# Patient Record
Sex: Male | Born: 1935 | Race: Black or African American | Hispanic: No | Marital: Single | Smoking: Current every day smoker
Health system: Southern US, Community
[De-identification: ages and names within clinical notes are randomized; demographics above are authoritative.]

## PROBLEM LIST (undated history)

## (undated) DIAGNOSIS — Z970 Presence of artificial eye: Secondary | ICD-10-CM

## (undated) DIAGNOSIS — I219 Acute myocardial infarction, unspecified: Secondary | ICD-10-CM

## (undated) DIAGNOSIS — E785 Hyperlipidemia, unspecified: Secondary | ICD-10-CM

## (undated) DIAGNOSIS — J449 Chronic obstructive pulmonary disease, unspecified: Secondary | ICD-10-CM

## (undated) DIAGNOSIS — I10 Essential (primary) hypertension: Secondary | ICD-10-CM

## (undated) HISTORY — PX: CORONARY ANGIOPLASTY WITH STENT PLACEMENT: SHX49

---

## 2009-10-11 DEATH — deceased

## 2015-10-19 ENCOUNTER — Emergency Department (HOSPITAL_BASED_OUTPATIENT_CLINIC_OR_DEPARTMENT_OTHER): Payer: Medicare Other

## 2015-10-19 ENCOUNTER — Encounter (HOSPITAL_BASED_OUTPATIENT_CLINIC_OR_DEPARTMENT_OTHER): Payer: Self-pay | Admitting: Emergency Medicine

## 2015-10-19 ENCOUNTER — Other Ambulatory Visit: Payer: Self-pay

## 2015-10-19 ENCOUNTER — Emergency Department (HOSPITAL_BASED_OUTPATIENT_CLINIC_OR_DEPARTMENT_OTHER)
Admission: EM | Admit: 2015-10-19 | Discharge: 2015-10-19 | Disposition: A | Discharge status: Home / Self Care | Payer: Medicare Other | Attending: Emergency Medicine | Admitting: Emergency Medicine

## 2015-10-19 DIAGNOSIS — J449 Chronic obstructive pulmonary disease, unspecified: Secondary | ICD-10-CM | POA: Insufficient documentation

## 2015-10-19 DIAGNOSIS — R079 Chest pain, unspecified: Secondary | ICD-10-CM | POA: Diagnosis present

## 2015-10-19 DIAGNOSIS — I2510 Atherosclerotic heart disease of native coronary artery without angina pectoris: Secondary | ICD-10-CM | POA: Insufficient documentation

## 2015-10-19 DIAGNOSIS — I10 Essential (primary) hypertension: Secondary | ICD-10-CM | POA: Insufficient documentation

## 2015-10-19 DIAGNOSIS — I252 Old myocardial infarction: Secondary | ICD-10-CM | POA: Diagnosis not present

## 2015-10-19 DIAGNOSIS — Z79899 Other long term (current) drug therapy: Secondary | ICD-10-CM | POA: Diagnosis not present

## 2015-10-19 DIAGNOSIS — F17290 Nicotine dependence, other tobacco product, uncomplicated: Secondary | ICD-10-CM | POA: Insufficient documentation

## 2015-10-19 DIAGNOSIS — Z7982 Long term (current) use of aspirin: Secondary | ICD-10-CM | POA: Insufficient documentation

## 2015-10-19 HISTORY — DX: Essential (primary) hypertension: I10

## 2015-10-19 HISTORY — DX: Acute myocardial infarction, unspecified: I21.9

## 2015-10-19 HISTORY — DX: Chronic obstructive pulmonary disease, unspecified: J44.9

## 2015-10-19 HISTORY — DX: Hyperlipidemia, unspecified: E78.5

## 2015-10-19 HISTORY — DX: Presence of artificial eye: Z97.0

## 2015-10-19 LAB — CBC WITH DIFFERENTIAL/PLATELET
BASOS PCT: 0 %
Basophils Absolute: 0 10*3/uL (ref 0.0–0.1)
EOS ABS: 0.3 10*3/uL (ref 0.0–0.7)
EOS PCT: 4 %
HCT: 33.6 % — ABNORMAL LOW (ref 39.0–52.0)
Hemoglobin: 11 g/dL — ABNORMAL LOW (ref 13.0–17.0)
LYMPHS ABS: 1.4 10*3/uL (ref 0.7–4.0)
Lymphocytes Relative: 18 %
MCH: 28.7 pg (ref 26.0–34.0)
MCHC: 32.7 g/dL (ref 30.0–36.0)
MCV: 87.7 fL (ref 78.0–100.0)
MONOS PCT: 17 %
Monocytes Absolute: 1.3 10*3/uL — ABNORMAL HIGH (ref 0.1–1.0)
Neutro Abs: 4.5 10*3/uL (ref 1.7–7.7)
Neutrophils Relative %: 61 %
PLATELETS: 202 10*3/uL (ref 150–400)
RBC: 3.83 MIL/uL — AB (ref 4.22–5.81)
RDW: 17.4 % — ABNORMAL HIGH (ref 11.5–15.5)
WBC: 7.4 10*3/uL (ref 4.0–10.5)

## 2015-10-19 LAB — BASIC METABOLIC PANEL
Anion gap: 6 (ref 5–15)
BUN: 15 mg/dL (ref 6–20)
CALCIUM: 9 mg/dL (ref 8.9–10.3)
CO2: 27 mmol/L (ref 22–32)
Chloride: 106 mmol/L (ref 101–111)
Creatinine, Ser: 1.13 mg/dL (ref 0.61–1.24)
GFR calc Af Amer: >60 mL/min (ref >60)
GFR, EST NON AFRICAN AMERICAN: 59 mL/min — AB (ref >60)
Glucose, Bld: 82 mg/dL (ref 65–99)
Potassium: 3.8 mmol/L (ref 3.5–5.1)
SODIUM: 139 mmol/L (ref 135–145)

## 2015-10-19 LAB — TROPONIN I: Troponin I: <0.03 ng/mL (ref <0.03)

## 2015-10-19 MED ORDER — IPRATROPIUM-ALBUTEROL 0.5-2.5 (3) MG/3ML IN SOLN
3.0000 mL | Freq: Four times a day (QID) | RESPIRATORY_TRACT | Status: DC
  Administered 2015-10-19: 3 mL via RESPIRATORY_TRACT
  Filled 2015-10-19: qty 3

## 2015-10-19 MED ORDER — PREDNISONE 50 MG PO TABS: 60.0000 mg | ORAL_TABLET | Freq: Once | ORAL | Status: DC

## 2015-10-19 MED ORDER — PREDNISONE 50 MG PO TABS: 50.0000 mg | ORAL_TABLET | Freq: Every day | ORAL | 0 refills | Status: DC

## 2015-10-19 NOTE — ED Provider Notes (Signed)
[facility] Provider Note   CSN: [id number] Arrival date & time: 10/19/15  1117     History   Chief Complaint Chief Complaint  Patient presents with  . Chest Pain    HPI Jose Lowery is a 82 y.o. male.  Patient states he started having a sharp pain in the right side of his chest yesterday. It persisted throughout the day. He tried a warm bath without much relief. he was able to go to bed last night when he woke up the pain was still there. He did have one episode yesterday where he was more intense and severe. He was concerned that some think serious might be going on. Patient does have history of heart disease including MI and stent. This feels that this pain is different from that. He denies any fevers or chills. No leg swelling.   The history is provided by the patient.  Chest Pain   This is a new problem. The current episode started yesterday. The problem occurs constantly. The pain is present in the lateral region (right). The pain is moderate. The quality of the pain is described as sharp. The pain does not radiate. Duration of episode(s) is 1 day. Associated symptoms include cough. Pertinent negatives include no abdominal pain, no diaphoresis, no leg pain, no lower extremity edema, no shortness of breath and no vomiting. He has tried nothing for the symptoms. The treatment provided no relief. Risk factors include smoking/tobacco exposure.  His past medical history is significant for CAD and MI.    Past Medical History:  Diagnosis Date  . COPD (chronic obstructive pulmonary disease) (HCC)   . Hyperlipidemia   . Hypertension   . Myocardial infarction   . Prosthetic eye globe    left    There are no active problems to display for this patient.   Past Surgical History:  Procedure Laterality Date  . CORONARY ANGIOPLASTY WITH STENT PLACEMENT         Home Medications    Prior to Admission medications   Medication Sig Start Date End Date Taking?  Authorizing Provider  albuterol (PROVENTIL HFA;VENTOLIN HFA) 108 (90 Base) MCG/ACT inhaler Inhale 2 puffs into the lungs every 6 (six) hours as needed for wheezing or shortness of breath.   Yes Historical Provider, MD  amLODipine (NORVASC) 10 MG tablet Take 10 mg by mouth daily.   Yes Historical Provider, MD  aspirin 81 MG chewable tablet Chew 81 mg by mouth daily.   Yes Historical Provider, MD  atorvastatin (LIPITOR) 80 MG tablet Take 80 mg by mouth daily.   Yes Historical Provider, MD  carvedilol (COREG) 12.5 MG tablet Take 12.5 mg by mouth 2 (two) times daily with a meal.   Yes Historical Provider, MD  hydrochlorothiazide (HYDRODIURIL) 12.5 MG tablet Take 12.5 mg by mouth daily.   Yes Historical Provider, MD  lisinopril (PRINIVIL,ZESTRIL) 40 MG tablet Take 40 mg by mouth daily.   Yes Historical Provider, MD  nitroGLYCERIN (NITROSTAT) 0.4 MG SL tablet Place 0.4 mg under the tongue every 5 (five) minutes as needed for chest pain.   Yes Historical Provider, MD  pantoprazole (PROTONIX) 20 MG tablet Take 20 mg by mouth 2 (two) times daily.   Yes Historical Provider, MD  tamsulosin (FLOMAX) 0.4 MG CAPS capsule Take 0.4 mg by mouth.   Yes Historical Provider, MD  ticagrelor (BRILINTA) 90 MG TABS tablet Take 90 mg by mouth 2 (two) times daily.   Yes Historical Provider, MD  predniSONE (DELTASONE)  50 MG tablet Take 1 tablet (50 mg total) by mouth daily. 10/19/15   Linwood Dibbles, MD    Family History History reviewed. No pertinent family history.  Social History Social History  Substance Use Topics  . Smoking status: Current Every Day Smoker    Packs/day: 0.50    Types: Cigars  . Smokeless tobacco: Never Used  . Alcohol use No     Allergies   Review of patient's allergies indicates no known allergies.   Review of Systems Review of Systems  Constitutional: Negative for diaphoresis.  Respiratory: Positive for cough. Negative for shortness of breath.   Cardiovascular: Positive for chest pain.    Gastrointestinal: Negative for abdominal pain and vomiting.  All other systems reviewed and are negative.    Physical Exam Updated Vital Signs BP 161/89   Pulse 92   Temp 98.1 F (36.7 C) (Oral)   Resp (!) 29   Ht 6\' 3"  (1.905 m)   Wt 86.2 kg   SpO2 94%   BMI 23.75 kg/m   Physical Exam  Constitutional: No distress.  HENT:  Head: Normocephalic and atraumatic.  Right Ear: External ear normal.  Left Ear: External ear normal.  Eyes: Conjunctivae are normal. Right eye exhibits no discharge. Left eye exhibits no discharge. No scleral icterus.  Neck: Neck supple. No tracheal deviation present.  Cardiovascular: Normal rate, regular rhythm and intact distal pulses.   Pulmonary/Chest: Effort normal. No stridor. No respiratory distress. He has wheezes. He has no rales.  Abdominal: Soft. Bowel sounds are normal. He exhibits no distension. There is no tenderness. There is no rebound and no guarding.  Musculoskeletal: He exhibits no edema or tenderness.  Neurological: He is alert. He has normal strength. No cranial nerve deficit (no facial droop, extraocular movements intact, no slurred speech) or sensory deficit. He exhibits normal muscle tone. He displays no seizure activity. Coordination normal.  Skin: Skin is warm and dry. No rash noted.  Psychiatric: He has a normal mood and affect.  Nursing note and vitals reviewed.    ED Treatments / Results  Labs (all labs ordered are listed, but only abnormal results are displayed) Labs Reviewed  CBC WITH DIFFERENTIAL/PLATELET - Abnormal; Notable for the following:       Result Value   RBC 3.83 (*)    Hemoglobin 11.0 (*)    HCT 33.6 (*)    RDW 17.4 (*)    Monocytes Absolute 1.3 (*)    All other components within normal limits  BASIC METABOLIC PANEL - Abnormal; Notable for the following:    GFR calc non Af Amer 59 (*)    All other components within normal limits  TROPONIN I  TROPONIN I    EKG  EKG  Interpretation  Date/Time:  Sunday October 19 2015 11:29:20 EDT Ventricular Rate:  72 PR Interval:    QRS Duration: 96 QT Interval:  394 QTC Calculation: 431 R Axis:   -61 Text Interpretation:  Atrial fibrillation with premature ventricular or aberrantly conducted complexes Left axis deviation Abnormal ECG No significant change since last tracing Confirmed by Bobetta Korf  MD-J, Falon Huesca ([id number]) on 10/19/2015 11:47:05 AM       Radiology Dg Chest 2 View  Result Date: 10/19/2015 CLINICAL DATA:  Chest pain EXAM: CHEST  2 VIEW COMPARISON:  None. FINDINGS: Mild cardiomegaly. The hila and mediastinum are unremarkable. No pneumothorax. No pulmonary nodules, masses, or focal infiltrates. IMPRESSION: No active cardiopulmonary disease. Electronically Signed   By: Gerome Sam III M.D  On: 10/19/2015 12:20    Procedures Procedures (including critical care time)  Medications Ordered in ED Medications  ipratropium-albuterol (DUONEB) 0.5-2.5 (3) MG/3ML nebulizer solution 3 mL (3 mLs Nebulization Given 10/19/15 1224)  predniSONE (DELTASONE) tablet 60 mg (not administered)     Initial Impression / Assessment and Plan / ED Course  I have reviewed the triage vital signs and the nursing notes.  Pertinent labs & imaging results that were available during my care of the patient were reviewed by me and considered in my medical decision making (see chart for details).  Clinical Course   Patient has had constant chest pain for over 24 hours. The pain was sharp and right-sided initially. The symptoms have slowly resolved. EKG does not show any acute ischemic changes. 2 sets of cardiac enzymes are normal. Chest x-ray does not show evidence of pneumonia or widened mediastinum.  Patient does smoke and has some wheezing exam. Think these symptoms may be pulmonary in nature. Plan on discharge home with a course of oral steroids. Follow up with his cardiologist or primary doctor this week.  Warning signs and  precautions discussed.  Final Clinical Impressions(s) / ED Diagnoses   Final diagnoses:  Chest pain, unspecified type    New Prescriptions New Prescriptions   PREDNISONE (DELTASONE) 50 MG TABLET    Take 1 tablet (50 mg total) by mouth daily.     Linwood Dibbles, MD 10/19/15 1536

## 2015-10-19 NOTE — ED Notes (Signed)
Patient transported to X-ray 

## 2015-10-19 NOTE — ED Triage Notes (Addendum)
Per daughter-patient began having chest pain last night.  Denies shortness of breath, dizziness, diaphoresis, N/V.  Patient reports pain to right side of chest.  States "it feels like a bone is broken right here" (point to right side of chest).  Patient took 1 nitro approximately PTA. Reports some relief in chest pain.

## 2015-10-19 NOTE — Discharge Instructions (Signed)
Follow up with your primary care doctor this week, use your inhalers and the steroids, return as needed for worsening symptoms, fever, abdominal pain, vomiting

## 2015-10-19 NOTE — ED Notes (Signed)
Pt on cardiac monitoring. Family at bedside

## 2015-10-19 NOTE — ED Notes (Signed)
Patient denies pain and is resting comfortably.  

## 2016-03-05 ENCOUNTER — Emergency Department (HOSPITAL_BASED_OUTPATIENT_CLINIC_OR_DEPARTMENT_OTHER): Payer: Medicare Other

## 2016-03-05 ENCOUNTER — Encounter (HOSPITAL_BASED_OUTPATIENT_CLINIC_OR_DEPARTMENT_OTHER): Payer: Self-pay | Admitting: *Deleted

## 2016-03-05 ENCOUNTER — Inpatient Hospital Stay (HOSPITAL_BASED_OUTPATIENT_CLINIC_OR_DEPARTMENT_OTHER)
Admission: EM | Admit: 2016-03-05 | Discharge: 2016-03-08 | DRG: 291 | Disposition: A | Discharge status: Home Health | Payer: Medicare Other | Attending: Internal Medicine | Admitting: Internal Medicine

## 2016-03-05 DIAGNOSIS — Z7952 Long term (current) use of systemic steroids: Secondary | ICD-10-CM

## 2016-03-05 DIAGNOSIS — D649 Anemia, unspecified: Secondary | ICD-10-CM | POA: Diagnosis present

## 2016-03-05 DIAGNOSIS — J111 Influenza due to unidentified influenza virus with other respiratory manifestations: Secondary | ICD-10-CM | POA: Diagnosis not present

## 2016-03-05 DIAGNOSIS — I482 Chronic atrial fibrillation, unspecified: Secondary | ICD-10-CM | POA: Diagnosis present

## 2016-03-05 DIAGNOSIS — J1008 Influenza due to other identified influenza virus with other specified pneumonia: Secondary | ICD-10-CM | POA: Diagnosis present

## 2016-03-05 DIAGNOSIS — Z79899 Other long term (current) drug therapy: Secondary | ICD-10-CM

## 2016-03-05 DIAGNOSIS — Z955 Presence of coronary angioplasty implant and graft: Secondary | ICD-10-CM | POA: Diagnosis not present

## 2016-03-05 DIAGNOSIS — I10 Essential (primary) hypertension: Secondary | ICD-10-CM

## 2016-03-05 DIAGNOSIS — J181 Lobar pneumonia, unspecified organism: Secondary | ICD-10-CM | POA: Diagnosis present

## 2016-03-05 DIAGNOSIS — E785 Hyperlipidemia, unspecified: Secondary | ICD-10-CM | POA: Diagnosis present

## 2016-03-05 DIAGNOSIS — F0390 Unspecified dementia without behavioral disturbance: Secondary | ICD-10-CM | POA: Diagnosis present

## 2016-03-05 DIAGNOSIS — Z7901 Long term (current) use of anticoagulants: Secondary | ICD-10-CM | POA: Diagnosis not present

## 2016-03-05 DIAGNOSIS — J101 Influenza due to other identified influenza virus with other respiratory manifestations: Secondary | ICD-10-CM | POA: Diagnosis not present

## 2016-03-05 DIAGNOSIS — J440 Chronic obstructive pulmonary disease with acute lower respiratory infection: Secondary | ICD-10-CM | POA: Diagnosis present

## 2016-03-05 DIAGNOSIS — R0602 Shortness of breath: Secondary | ICD-10-CM | POA: Diagnosis present

## 2016-03-05 DIAGNOSIS — Z970 Presence of artificial eye: Secondary | ICD-10-CM | POA: Diagnosis not present

## 2016-03-05 DIAGNOSIS — I252 Old myocardial infarction: Secondary | ICD-10-CM | POA: Diagnosis not present

## 2016-03-05 DIAGNOSIS — H918X2 Other specified hearing loss, left ear: Secondary | ICD-10-CM | POA: Diagnosis present

## 2016-03-05 DIAGNOSIS — N400 Enlarged prostate without lower urinary tract symptoms: Secondary | ICD-10-CM | POA: Diagnosis present

## 2016-03-05 DIAGNOSIS — F17290 Nicotine dependence, other tobacco product, uncomplicated: Secondary | ICD-10-CM | POA: Diagnosis present

## 2016-03-05 DIAGNOSIS — I509 Heart failure, unspecified: Secondary | ICD-10-CM

## 2016-03-05 DIAGNOSIS — I110 Hypertensive heart disease with heart failure: Principal | ICD-10-CM | POA: Diagnosis present

## 2016-03-05 DIAGNOSIS — R0902 Hypoxemia: Secondary | ICD-10-CM | POA: Diagnosis present

## 2016-03-05 DIAGNOSIS — K219 Gastro-esophageal reflux disease without esophagitis: Secondary | ICD-10-CM | POA: Diagnosis present

## 2016-03-05 DIAGNOSIS — I2510 Atherosclerotic heart disease of native coronary artery without angina pectoris: Secondary | ICD-10-CM | POA: Diagnosis present

## 2016-03-05 DIAGNOSIS — G9340 Encephalopathy, unspecified: Secondary | ICD-10-CM

## 2016-03-05 DIAGNOSIS — I5033 Acute on chronic diastolic (congestive) heart failure: Secondary | ICD-10-CM | POA: Diagnosis present

## 2016-03-05 DIAGNOSIS — J9601 Acute respiratory failure with hypoxia: Secondary | ICD-10-CM | POA: Diagnosis present

## 2016-03-05 DIAGNOSIS — J441 Chronic obstructive pulmonary disease with (acute) exacerbation: Secondary | ICD-10-CM | POA: Diagnosis present

## 2016-03-05 DIAGNOSIS — Z7982 Long term (current) use of aspirin: Secondary | ICD-10-CM

## 2016-03-05 DIAGNOSIS — Z006 Encounter for examination for normal comparison and control in clinical research program: Secondary | ICD-10-CM

## 2016-03-05 LAB — MRSA PCR SCREENING: MRSA by PCR: NEGATIVE

## 2016-03-05 LAB — COMPREHENSIVE METABOLIC PANEL
ALBUMIN: 3.2 g/dL — AB (ref 3.5–5.0)
ALT: 15 U/L — AB (ref 17–63)
AST: 32 U/L (ref 15–41)
Alkaline Phosphatase: 56 U/L (ref 38–126)
Anion gap: 6 (ref 5–15)
BUN: 15 mg/dL (ref 6–20)
CHLORIDE: 99 mmol/L — AB (ref 101–111)
CO2: 31 mmol/L (ref 22–32)
CREATININE: 1.12 mg/dL (ref 0.61–1.24)
Calcium: 8.6 mg/dL — ABNORMAL LOW (ref 8.9–10.3)
GFR calc Af Amer: >60 mL/min (ref >60)
GFR calc non Af Amer: 60 mL/min — ABNORMAL LOW (ref >60)
Glucose, Bld: 111 mg/dL — ABNORMAL HIGH (ref 65–99)
POTASSIUM: 3.7 mmol/L (ref 3.5–5.1)
Sodium: 136 mmol/L (ref 135–145)
Total Bilirubin: 0.8 mg/dL (ref 0.3–1.2)
Total Protein: 7.5 g/dL (ref 6.5–8.1)

## 2016-03-05 LAB — CBC WITH DIFFERENTIAL/PLATELET
Basophils Absolute: 0 10*3/uL (ref 0.0–0.1)
Basophils Relative: 0 %
EOS ABS: 0 10*3/uL (ref 0.0–0.7)
EOS PCT: 0 %
HEMATOCRIT: 31.3 % — AB (ref 39.0–52.0)
Hemoglobin: 9.6 g/dL — ABNORMAL LOW (ref 13.0–17.0)
LYMPHS ABS: 1 10*3/uL (ref 0.7–4.0)
Lymphocytes Relative: 20 %
MCH: 25.3 pg — AB (ref 26.0–34.0)
MCHC: 30.7 g/dL (ref 30.0–36.0)
MCV: 82.6 fL (ref 78.0–100.0)
MONO ABS: 1.6 10*3/uL — AB (ref 0.1–1.0)
MONOS PCT: 32 %
Neutro Abs: 2.4 10*3/uL (ref 1.7–7.7)
Neutrophils Relative %: 48 %
Platelets: 180 10*3/uL (ref 150–400)
RBC: 3.79 MIL/uL — AB (ref 4.22–5.81)
RDW: 19.9 % — AB (ref 11.5–15.5)
WBC: 5 10*3/uL (ref 4.0–10.5)

## 2016-03-05 LAB — PROTIME-INR
INR: 1.52
PROTHROMBIN TIME: 18.5 s — AB (ref 11.4–15.2)

## 2016-03-05 LAB — INFLUENZA PANEL BY PCR (TYPE A & B)
Influenza A By PCR: POSITIVE — AB
Influenza B By PCR: NEGATIVE

## 2016-03-05 LAB — TROPONIN I
Troponin I: <0.03 ng/mL (ref <0.03)
Troponin I: <0.03 ng/mL (ref <0.03)

## 2016-03-05 LAB — BRAIN NATRIURETIC PEPTIDE: B Natriuretic Peptide: 524.9 pg/mL — ABNORMAL HIGH (ref 0.0–100.0)

## 2016-03-05 MED ORDER — NITROGLYCERIN 0.4 MG SL SUBL: 0.4000 mg | SUBLINGUAL_TABLET | SUBLINGUAL | Status: DC | PRN

## 2016-03-05 MED ORDER — SODIUM CHLORIDE 0.9% FLUSH
3.0000 mL | Freq: Two times a day (BID) | INTRAVENOUS | Status: DC
  Administered 2016-03-05 – 2016-03-08 (×6): 3 mL via INTRAVENOUS

## 2016-03-05 MED ORDER — OSELTAMIVIR PHOSPHATE 75 MG PO CAPS
75.0000 mg | ORAL_CAPSULE | Freq: Two times a day (BID) | ORAL | Status: DC
  Administered 2016-03-05 – 2016-03-08 (×6): 75 mg via ORAL
  Filled 2016-03-05 (×6): qty 1

## 2016-03-05 MED ORDER — IPRATROPIUM-ALBUTEROL 0.5-2.5 (3) MG/3ML IN SOLN
RESPIRATORY_TRACT | Status: AC
  Filled 2016-03-05: qty 3

## 2016-03-05 MED ORDER — IPRATROPIUM-ALBUTEROL 0.5-2.5 (3) MG/3ML IN SOLN
3.0000 mL | Freq: Once | RESPIRATORY_TRACT | Status: AC
  Administered 2016-03-05: 3 mL via RESPIRATORY_TRACT

## 2016-03-05 MED ORDER — TAMSULOSIN HCL 0.4 MG PO CAPS
0.4000 mg | ORAL_CAPSULE | Freq: Every day | ORAL | Status: DC
  Administered 2016-03-05 – 2016-03-07 (×3): 0.4 mg via ORAL
  Filled 2016-03-05 (×3): qty 1

## 2016-03-05 MED ORDER — FUROSEMIDE 10 MG/ML IJ SOLN
40.0000 mg | Freq: Once | INTRAMUSCULAR | Status: AC
  Administered 2016-03-05: 40 mg via INTRAVENOUS
  Filled 2016-03-05: qty 4

## 2016-03-05 MED ORDER — ATORVASTATIN CALCIUM 80 MG PO TABS
80.0000 mg | ORAL_TABLET | Freq: Every day | ORAL | Status: DC
  Administered 2016-03-06 – 2016-03-08 (×3): 80 mg via ORAL
  Filled 2016-03-05 (×3): qty 1

## 2016-03-05 MED ORDER — GUAIFENESIN ER 600 MG PO TB12
600.0000 mg | ORAL_TABLET | Freq: Two times a day (BID) | ORAL | Status: DC
  Administered 2016-03-05 – 2016-03-08 (×6): 600 mg via ORAL
  Filled 2016-03-05 (×6): qty 1

## 2016-03-05 MED ORDER — METHYLPREDNISOLONE SODIUM SUCC 125 MG IJ SOLR
125.0000 mg | Freq: Once | INTRAMUSCULAR | Status: AC
  Administered 2016-03-05: 125 mg via INTRAVENOUS
  Filled 2016-03-05: qty 2

## 2016-03-05 MED ORDER — SODIUM CHLORIDE 0.9 % IV SOLN
Freq: Once | INTRAVENOUS | Status: AC
  Administered 2016-03-05: 14:00:00 via INTRAVENOUS

## 2016-03-05 MED ORDER — TICAGRELOR 90 MG PO TABS: 90.0000 mg | ORAL_TABLET | Freq: Two times a day (BID) | ORAL | Status: DC

## 2016-03-05 MED ORDER — ALBUTEROL SULFATE (2.5 MG/3ML) 0.083% IN NEBU
INHALATION_SOLUTION | RESPIRATORY_TRACT | Status: AC
  Administered 2016-03-05: 5 mg
  Filled 2016-03-05: qty 6

## 2016-03-05 MED ORDER — WARFARIN - PHARMACIST DOSING INPATIENT
Freq: Every day | Status: DC
  Administered 2016-03-06: 2.5
  Administered 2016-03-07: 18:00:00

## 2016-03-05 MED ORDER — WARFARIN SODIUM 2 MG PO TABS
2.0000 mg | ORAL_TABLET | Freq: Once | ORAL | Status: AC
  Administered 2016-03-05: 2 mg via ORAL
  Filled 2016-03-05: qty 1

## 2016-03-05 MED ORDER — ALBUTEROL SULFATE (2.5 MG/3ML) 0.083% IN NEBU
INHALATION_SOLUTION | RESPIRATORY_TRACT | Status: AC
  Filled 2016-03-05: qty 3

## 2016-03-05 MED ORDER — PANTOPRAZOLE SODIUM 20 MG PO TBEC
20.0000 mg | DELAYED_RELEASE_TABLET | Freq: Two times a day (BID) | ORAL | Status: DC
  Administered 2016-03-05 – 2016-03-08 (×6): 20 mg via ORAL
  Filled 2016-03-05 (×6): qty 1

## 2016-03-05 MED ORDER — METHYLPREDNISOLONE SODIUM SUCC 125 MG IJ SOLR
60.0000 mg | Freq: Four times a day (QID) | INTRAMUSCULAR | Status: DC
  Administered 2016-03-05 – 2016-03-06 (×3): 60 mg via INTRAVENOUS
  Filled 2016-03-05 (×3): qty 2

## 2016-03-05 MED ORDER — AZITHROMYCIN 500 MG IV SOLR
INTRAVENOUS | Status: AC
  Filled 2016-03-05: qty 500

## 2016-03-05 MED ORDER — CARVEDILOL 12.5 MG PO TABS
12.5000 mg | ORAL_TABLET | Freq: Two times a day (BID) | ORAL | Status: DC
  Administered 2016-03-06 – 2016-03-08 (×5): 12.5 mg via ORAL
  Filled 2016-03-05: qty 1
  Filled 2016-03-05: qty 2
  Filled 2016-03-05 (×2): qty 1
  Filled 2016-03-05: qty 2
  Filled 2016-03-05: qty 1

## 2016-03-05 MED ORDER — FUROSEMIDE 10 MG/ML IJ SOLN
40.0000 mg | Freq: Every day | INTRAMUSCULAR | Status: DC
  Administered 2016-03-06: 40 mg via INTRAVENOUS
  Filled 2016-03-05: qty 4

## 2016-03-05 MED ORDER — ALBUTEROL SULFATE (2.5 MG/3ML) 0.083% IN NEBU
3.0000 mL | INHALATION_SOLUTION | Freq: Four times a day (QID) | RESPIRATORY_TRACT | Status: DC | PRN
  Filled 2016-03-05: qty 3

## 2016-03-05 MED ORDER — AZITHROMYCIN 500 MG PO TABS
500.0000 mg | ORAL_TABLET | Freq: Every day | ORAL | Status: DC
  Administered 2016-03-05 – 2016-03-08 (×4): 500 mg via ORAL
  Filled 2016-03-05 (×4): qty 1

## 2016-03-05 MED ORDER — ASPIRIN 81 MG PO CHEW
81.0000 mg | CHEWABLE_TABLET | Freq: Every day | ORAL | Status: DC
Start: 2016-03-05 — End: 2016-03-08
  Administered 2016-03-06 – 2016-03-08 (×3): 81 mg via ORAL
  Filled 2016-03-05 (×3): qty 1

## 2016-03-05 MED ORDER — DEXTROSE 5 % IV SOLN
1.0000 g | INTRAVENOUS | Status: DC
  Administered 2016-03-05: 1 g via INTRAVENOUS
  Filled 2016-03-05: qty 10

## 2016-03-05 MED ORDER — CEFTRIAXONE SODIUM 1 G IJ SOLR
1.0000 g | Freq: Once | INTRAMUSCULAR | Status: AC
  Administered 2016-03-05: 1 g via INTRAVENOUS
  Filled 2016-03-05: qty 10

## 2016-03-05 MED ORDER — FUROSEMIDE 10 MG/ML IJ SOLN: 40.0000 mg | Freq: Once | INTRAMUSCULAR | Status: DC

## 2016-03-05 MED ORDER — ALBUTEROL SULFATE (2.5 MG/3ML) 0.083% IN NEBU
2.5000 mg | INHALATION_SOLUTION | Freq: Once | RESPIRATORY_TRACT | Status: AC
  Administered 2016-03-05: 2.5 mg via RESPIRATORY_TRACT

## 2016-03-05 MED ORDER — AZITHROMYCIN 500 MG IV SOLR
500.0000 mg | Freq: Once | INTRAVENOUS | Status: AC
  Administered 2016-03-05: 500 mg via INTRAVENOUS

## 2016-03-05 NOTE — ED Notes (Signed)
Blood cultures x 2 drawn and given to lab to hold prior to administration of antibiotics. [facility]; [facility]

## 2016-03-05 NOTE — ED Notes (Signed)
Dr. Verdie MosherLiu made aware of pt's condition and VS

## 2016-03-05 NOTE — ED Triage Notes (Signed)
Pt reports SOB since Wednesday. Uses home inhaler without relief. Family member reports pt usually is active and walks daily. Audible wheezing. Room air sats 80%. RT at bedside to assess and give neb

## 2016-03-05 NOTE — H&P (Signed)
History and Physical  Jose Lowery ZOX:[id number] DOB: 1932-03-14 DOA: 03/05/2016  Referring physician: EDP PCP: Patria Mane, MD   Chief Complaint: sob, wheezing, hypoxia  HPI: Jose Lowery is a 83 y.o. male   With h/o HTN, HLD, CAD s/p stent , was on ticagrelor before, afib on coumadin, copd, not on home o2 or chronic steroids, he presented [facility] due to sob, wheezing, he was found to be hypoxic , o2 sats 79-80% on room air, cxr potential interstitial edema and questionable early lobar pneumonia. He has wheezing on exam, ekg with afib, rate controlled, first troponin negative, he received iv lasix due to concern of chf, he is also given rocephin and zithromax due to concerns of CAP, he is put on bipap, EDP called [facility] hospitalist Dr Adrian Blackwater who accepted patient to [place] stepdown unit.  On arrival to [place], he is feeling better, he is weaned down to 4liters and not in distress. Influenza testing showed + influenza A. He denies chest pain, no fever, he report some dry cough, mild bilateral lower extremity edema. Family at bedside.  Review of Systems:  Detail per HPI, Review of systems are otherwise negative  Past Medical History:  Diagnosis Date  . COPD (chronic obstructive pulmonary disease) (HCC)   . Hyperlipidemia   . Hypertension   . Myocardial infarction   . Prosthetic eye globe    left   Past Surgical History:  Procedure Laterality Date  . CORONARY ANGIOPLASTY WITH STENT PLACEMENT     Social History:  reports that he has been smoking Cigars.  He has been smoking about 0.50 packs per day. He has never used smokeless tobacco. He reports that he drinks alcohol. He reports that he does not use drugs. Patient lives at home & is able to participate in activities of daily living independently   No Known Allergies  No family history on file.    Prior to Admission medications   Medication Sig Start Date End Date Taking? Authorizing Provider    albuterol (PROVENTIL HFA;VENTOLIN HFA) 108 (90 Base) MCG/ACT inhaler Inhale 2 puffs into the lungs every 6 (six) hours as needed for wheezing or shortness of breath.    Historical Provider, MD  amLODipine (NORVASC) 10 MG tablet Take 10 mg by mouth daily.    Historical Provider, MD  aspirin 81 MG chewable tablet Chew 81 mg by mouth daily.    Historical Provider, MD  atorvastatin (LIPITOR) 80 MG tablet Take 80 mg by mouth daily.    Historical Provider, MD  carvedilol (COREG) 12.5 MG tablet Take 12.5 mg by mouth 2 (two) times daily with a meal.    Historical Provider, MD  hydrochlorothiazide (HYDRODIURIL) 12.5 MG tablet Take 12.5 mg by mouth daily.    Historical Provider, MD  lisinopril (PRINIVIL,ZESTRIL) 40 MG tablet Take 40 mg by mouth daily.    Historical Provider, MD  nitroGLYCERIN (NITROSTAT) 0.4 MG SL tablet Place 0.4 mg under the tongue every 5 (five) minutes as needed for chest pain.    Historical Provider, MD  pantoprazole (PROTONIX) 20 MG tablet Take 20 mg by mouth 2 (two) times daily.    Historical Provider, MD  predniSONE (DELTASONE) 50 MG tablet Take 1 tablet (50 mg total) by mouth daily. 10/19/15   Linwood Dibbles, MD  tamsulosin (FLOMAX) 0.4 MG CAPS capsule Take 0.4 mg by mouth.    Historical Provider, MD  ticagrelor (BRILINTA) 90 MG TABS tablet Take 90 mg by mouth 2 (two)  times daily.    Historical Provider, MD    Physical Exam: BP 119/79 (BP Location: Right Arm)   Pulse 92   Temp 98.6 F (37 C) (Oral)   Resp 20   Ht 6\' 3"  (1.905 m)   Wt 85.5 kg (188 lb 7.9 oz)   SpO2 99%   BMI 23.56 kg/m   General:  NAD Eyes: left eye blind ( chronic) ENT: left ear impaired hearing, edentulous Neck: supple, no JVD Cardiovascular: IRRR Respiratory: diffuse bilateral wheezing both anteriorly and posteriorly Abdomen: soft/ND/ND, positive bowel sounds Skin: no rash Musculoskeletal:  Trace pitting edema bilateral lower extremity Psychiatric: calm/cooperative Neurologic: no focal findings             Labs on Admission:  Basic Metabolic Panel:  Recent Labs Lab 03/05/16 1340  NA 136  K 3.7  CL 99*  CO2 31  GLUCOSE 111*  BUN 15  CREATININE 1.12  CALCIUM 8.6*   Liver Function Tests:  Recent Labs Lab 03/05/16 1340  AST 32  ALT 15*  ALKPHOS 56  BILITOT 0.8  PROT 7.5  ALBUMIN 3.2*   No results for input(s): LIPASE, AMYLASE in the last 168 hours. No results for input(s): AMMONIA in the last 168 hours. CBC:  Recent Labs Lab 03/05/16 1225  WBC 5.0  NEUTROABS 2.4  HGB 9.6*  HCT 31.3*  MCV 82.6  PLT 180   Cardiac Enzymes:  Recent Labs Lab 03/05/16 1225  TROPONINI <0.03    BNP (last 3 results)  Recent Labs  03/05/16 1225  BNP 524.9*    ProBNP (last 3 results) No results for input(s): PROBNP in the last 8760 hours.  CBG: No results for input(s): GLUCAP in the last 168 hours.  Radiological Exams on Admission: Dg Chest Portable 1 View  Result Date: 03/05/2016 CLINICAL DATA:  Cough, congestion and shortness of breath. Wheezing over the last 3 days. EXAM: PORTABLE CHEST 1 VIEW COMPARISON:  10/19/2015 FINDINGS: Mild cardiomegaly. Aortic atherosclerosis. Somewhat poor inspiration. Allowing for that, there appears to be prominence of the interstitial markings. This could go along with mild fluid overload/ early congestive heart failure. Some question about hazy infiltrates in the lower lobes, but this is not definite. Two-view chest radiography would be useful when able. IMPRESSION: Poor inspiration. Question fluid overload/ early interstitial edema. Also question of hazy lower lobe density that could represent developing pneumonia. Consider two-view radiography when able. Electronically Signed   By: Paulina FusiMark  Shogry M.D.   On: 03/05/2016 12:54      Assessment/Plan Present on Admission: . Acute respiratory failure with hypoxia (HCC)  Acute hypoxic respiratory failure from influenza a and copd exacerbation , +/-mild chf? [facility],  now weaned down to 4liter, baseline he is not oxygen dependent Treat underline cause, wean oxygen  Influenza A: Start tamiflu On droplet precaution Pulmonologist Dr Marchelle Gearingamaswamy is discussing with patient and family about IVIG trial for Influenza A infection.  COPD exacerbation:  Cxr: question of hazy lower lobe density that could represent developing pneumonia. Consider two-view radiography when able. Abx/steroids/nebs/mucinex/o2  Chronic Afib, rate controlled on coreg, on coumadin, however,  INR 1.5 subtherapeutic, pharmacy to dose coumadin   CHF? No echo available, bnp 524, cxr with ?early interstitial edema. Mild bilateral lower extremity pitting edema, will get echo, iv lasix, trend troponin  CAD s/p Stent, detail unknown, patient report was on ticagrelor, currently he is on asa/statin/coreg which is continued on admission.  No chest pain, troponin negative.  HTN; continue coreg, bp low normal , hold lisinopril, norvasc and hctz for now , resume when bp allows.  Normocytic Anemia:  per patient he was told to start on iron supplement recently.  hgn 11 in 2017, hgb 9.6 today. Anemia work up  BPH, continue flomax.  DVT prophylaxis: on coumadin  Consultants: none  Code Status: full   Family Communication:  Patient and family at bedside  Disposition Plan: admit to stepdown, likely able to transfer to med tele if patient continue to improve  Time spent:  Jevon Littlepage MD, PhD [facility] Pager [phone] If 7PM-7AM, please contact night-coverage at [email or web address], password [facility]

## 2016-03-05 NOTE — Progress Notes (Addendum)
Patient admitted to Room 19 alert orient and slightly confused about plan of care.  Oriented patient to hospitalization and reason for stay to help with breathing and decrease amount of fluid around his heart.  Patient able to verberalize state back understand ing of hospitalization. Patient placed on 4L/[place] to see if he tolerates without the bipap.

## 2016-03-05 NOTE — Progress Notes (Signed)
ANTICOAGULATION CONSULT NOTE - Initial Consult  Pharmacy Consult for Warfarin Indication: atrial fibrillation  No Known Allergies  Patient Measurements: Height: 6\' 3"  (190.5 cm) Weight: 188 lb 7.9 oz (85.5 kg) IBW/kg (Calculated) : 84.5  Vital Signs: Temp: 99.4 F (37.4 C) (02/23 1921) Temp Source: Oral (02/23 1921) BP: 120/68 (02/23 1921) Pulse Rate: 72 (02/23 1921)  Labs:  Recent Labs  03/05/16 1225 03/05/16 1340  HGB 9.6*  --   HCT 31.3*  --   PLT 180  --   LABPROT  --  18.5*  INR  --  1.52  CREATININE  --  1.12  TROPONINI <0.03  --     Estimated Creatinine Clearance: 61.8 mL/min (by C-G formula based on SCr of 1.12 mg/dL).   Medical History: Past Medical History:  Diagnosis Date  . COPD (chronic obstructive pulmonary disease) (HCC)   . Hyperlipidemia   . Hypertension   . Myocardial infarction   . Prosthetic eye globe    left      Assessment: Jose Lowery with Hx Afib and on warfarin PTA, admited with flu+.   Admit  INR 1.52, h/h slightly lower than previous- FE studies ordered for am, no overt bleeding noted.  Daughter states she knows the Warfarin tablet is 2mg  but unsure of actual dose from last anticoagulation visit - will bring from home tomorrow.     Goal of Therapy:  INR 2-3 Monitor platelets by anticoagulation protocol: Yes   Plan:  Warfarin 2mg  x1 tonight Daily Protime  Leota SauersLisa Dempsy Damiano Pharm.D. CPP, BCPS Clinical Pharmacist [phone] 03/05/2016 7:33 PM

## 2016-03-05 NOTE — Progress Notes (Addendum)
Title: A Randomized, Double-Blind, Placebo-Controlled Dose Ranging Study Evaluating the Safety Pharmacokinetics and Clinical Benefit of FLU-IGIV in Hospitalized Patients with Serious Influenza A infection. IA-001 (ClinicalTrials.gov Identifier: [id number], Protocol No: IA-001, [facility] Protocol #[id number])  RESEARCH SUBJECT. This research study is sponsored by [employer]   The investigational product is called NP-025 aka FLU-IGIV or anti-influenza immune globulin intravenous. It is produced from source plasma collected from [place] Transport planner (FDA) licensed plasma collection establishments from healthy donors who have recovered from influenza (convalescent) and/or were vaccinated against seasonal influenza strains. The plasma contains a relatively high concentration of polyclonal antibodies directed against seasonal influenza strains, specifically influenza A strains H1N1 ([place], [place]) and H3N2 ([place]). It is a glycoprotein of 150-160 kilodaltons against Hemagluttinin (HA) and Neuraminidase (NA) surface proteins.   Key Inclusion Criteria: Adult patients; locally determined positive influenza A infection (Rapid Antigen Test or PCR) from a specimen obtained within 2 days prior to randomization; onset of symptoms </= 6 days prior to randomization; experiencing >/= 1 respiratory symptom (cough, sore throat, nasal congestion) and >/= 1 constitutional symptom (headache, myalgia, feverishness or fatigue); NEW score >/=3 at screening; must have an active order for a minimum 5 day course of oseltamivir ('75mg'$ /twice daily).  Key Exclusion Criteria: History of hypersensitivity to blood or plasma products; history of allergy to latex or rubber; pregnancy or lactation; known IgA deficiency; medical conditions for which receipt of a 500 mL volume of IV fluid may be dangerous; a pre-existing condition or use of medication that may place the individual at a  substantially increased risk of thrombosis; anticipated life expectancy < 90 days; confirmed bacterial pneumonia or any concurrent respiratory viral infection that is not influenza A.  Key Randomization Information: Patients will be randomized to receive a single IV administration of FLU-IGIV (NP-025) or placebo (normal saline 0.9% NaCL). Two dose levels are being assessed, each a fixed dose by volume - 10 vials (450 mL) for the high dose and 5 vials (225 mL) for the low dose. Assuming a patient weight range of 60 kg to 100 kg, patients will receive approximately 0.32 - 0.53 g/kg of IgG protein for the high dose and approximately 0.16 - 0.26 g/kg for the low dose, in a single infusion.  Key other data: Side effects with infusion  Incidence Occurrence Side effect  Common 1-15%, typically < 5% First 30 minutes of infusion Back pain, abdominal pain, nausea, vomiting  Common 1-15%, typically < 5% First few minutes to several hours after infusion Chills, fever, headache, myalgia, fatigue  Rare Seconds to several hours after infusion Hypersensitivity reaction (happens with IgA deficiency), flushing, facial swelling, dyspnea, cyanosis, shock, cardiac arrest, pneumonitis, renal failure, aseptic meningitis, hemolysis, viral infection  Rare Seconds to several hours after infusion Thrombosis (at risk patients are those with a history of DVT, arterial thrombosis, multiple cardiovascular risk factors, impaired cardiac output, advanced age, coagulation disorders, prolonged immobilization, use of estrogen, indwelling catheters, known/suspected hyperviscosity)  Recommendation: For patients who are at risk of developing thrombotic events, administer NP-025 at the minimum rate of infusion practicable, not to exceed 2 mL/min. Ensure adequate hydration in patients before administration. Monitor  Rare 1-6 hours after infusion Transfusion related Acute Lung Injury (TRALI)  Rare  Hemolysis (dose related, happens in total >  2g/kg and non-O blood group), severe hemolysis may lead to renal dysfunction/failure.  Recommendation: Check hemoglobin pre- and post 36-96h and 7-10 days transfusion  Rare Several hours to two  days  after infusion Aseptic Meningitis (AMS) (dose related, happens in total > 2g/kg)   NOTE: NP-025 contains 10% maltose - will interfere with glucose measurements if non-glucose specific measurement devices are not used. Results in false high glucose levels can  result in increased insulin -> life threatening hypoglycemia  ...................................................................................................  1. Scientific Purpose  Clinical research is designed to produce generalizable knowledge and to answer questions about the safety and efficacy of intervention(s) under study in order to determine whether or not they may be useful for the care of future patients.  2. Study Procedures  Participation in a trial may involve procedures or tests, in addition to the intervention(s) under study, that are intended only or primarily to generate scientific knowledge and that are otherwise not necessary for patient care.   3. Uncertainty  For intervention(s) under study in clinical research, there often is less knowledge and more uncertainty about the risks and benefits to a population of trial participants than there is when a doctor offers a patient standard interventions.   4. Adherence to Protocol  Administration of the intervention(s) under study is typically based on a strict protocol with defined dose, scheduling, and use or avoidance of concurrent medications, compared to administration of standard interventions.  5. Clinician as Investigator  Clinicians who are in health care settings provide treatment; in a clinical trial setting, they are also investigating safety and efficacy of an intervention. In otherwise your doctor or nurse practitioner can be wearing 2 hats - one as care  giver another as Oceanographerresearch investigator  6. Patient as Engineer, agriculturalVolunteer or Research Subject  Patients participating in research trials are research subjects or volunteers. In other words participating in research is 100% voluntary and at one's own free weill. The decision to participate or not participate will NOT affect patient care and the doctor-patient relationship in any way  7. Financial Conflict of Interest Disclosure  One or more of the investigators of  the research trial might an investment interest in [employer] the clinical trials site and is both the company and the investigators are being compensated for their effort in trial research activities.      8. Consenting Spent 30 min going over both aspects. ICF copy given. Daughter Cipriano BunkerShirley  Lindsay reading it. Patient listened but is HOH and too fatigued to say one way or another. Daughter asked him if he understood and he said yes but it looks like this will be mostly a LAR consent/assent  9. I/E: hx per son - reported onset of cough and wheezing wed 03/03/16 that was abrupt and fatigue on day of admission ("tired and not looking right" because normally he is outdoors and walking and active) 03/05/2016 but denied fever and chills. Did not get flu shot for season   Will reasess again 03/06/16 AM approx 9am   Dr. Kalman ShanMurali Melquan Ernsberger, M.D., [facility].C.C.P Pulmonary and Critical Care Medicine Staff Physician [place] [facility] [place] [facility] Pager: [phone], If no answer or between  15:00h - 7:00h: call [phone]  03/05/2016 6:00 PM

## 2016-03-05 NOTE — ED Provider Notes (Signed)
[facility] Provider Note   CSN: [id number] Arrival date & time: 03/05/16  1130     History   Chief Complaint Chief Complaint  Patient presents with  . Shortness of Breath    HPI Jose SimmondsRobert Dacey is a 83 y.o. male.  HPI 83 year old male who presents with shortness of breath. He has a history of COPD, not on chronic oxygen, hypertension, hyperlipidemia, CAD, atrial fibrillation on Coumadin. Onset of shortness of breath with intermittently productive cough that spoke worsening over the past 2-3 days. Has had runny nose and congestion, but no fevers or chills. Has had trace lower extremity edema. No chest pain. Tried home inhalers, without improvement in shortness of breath. Brought to ED today for evaluation.   Past Medical History:  Diagnosis Date  . COPD (chronic obstructive pulmonary disease) (HCC)   . Hyperlipidemia   . Hypertension   . Myocardial infarction   . Prosthetic eye globe    left    Patient Active Problem List   Diagnosis Date Noted  . Acute respiratory failure with hypoxia (HCC) 03/05/2016    Past Surgical History:  Procedure Laterality Date  . CORONARY ANGIOPLASTY WITH STENT PLACEMENT         Home Medications    Prior to Admission medications   Medication Sig Start Date End Date Taking? Authorizing Provider  albuterol (PROVENTIL HFA;VENTOLIN HFA) 108 (90 Base) MCG/ACT inhaler Inhale 2 puffs into the lungs every 6 (six) hours as needed for wheezing or shortness of breath.    Historical Provider, MD  amLODipine (NORVASC) 10 MG tablet Take 10 mg by mouth daily.    Historical Provider, MD  aspirin 81 MG chewable tablet Chew 81 mg by mouth daily.    Historical Provider, MD  atorvastatin (LIPITOR) 80 MG tablet Take 80 mg by mouth daily.    Historical Provider, MD  carvedilol (COREG) 12.5 MG tablet Take 12.5 mg by mouth 2 (two) times daily with a meal.    Historical Provider, MD  hydrochlorothiazide (HYDRODIURIL) 12.5 MG tablet Take 12.5 mg by  mouth daily.    Historical Provider, MD  lisinopril (PRINIVIL,ZESTRIL) 40 MG tablet Take 40 mg by mouth daily.    Historical Provider, MD  nitroGLYCERIN (NITROSTAT) 0.4 MG SL tablet Place 0.4 mg under the tongue every 5 (five) minutes as needed for chest pain.    Historical Provider, MD  pantoprazole (PROTONIX) 20 MG tablet Take 20 mg by mouth 2 (two) times daily.    Historical Provider, MD  predniSONE (DELTASONE) 50 MG tablet Take 1 tablet (50 mg total) by mouth daily. 10/19/15   Linwood DibblesJon Knapp, MD  tamsulosin (FLOMAX) 0.4 MG CAPS capsule Take 0.4 mg by mouth.    Historical Provider, MD  ticagrelor (BRILINTA) 90 MG TABS tablet Take 90 mg by mouth 2 (two) times daily.    Historical Provider, MD    Family History No family history on file.  Social History Social History  Substance Use Topics  . Smoking status: Current Every Day Smoker    Packs/day: 0.50    Types: Cigars  . Smokeless tobacco: Never Used  . Alcohol use Yes     Comment: beer occasionally     Allergies   Patient has no known allergies.   Review of Systems Review of Systems 10/14 systems reviewed and are negative other than those stated in the HPI   Physical Exam Updated Vital Signs BP 120/74 (BP Location: Right Arm)   Pulse 78   Temp 99.3 F (  37.4 C) (Oral)   Resp 21   Ht 6\' 3"  (1.905 m)   Wt 208 lb (94.3 kg)   SpO2 100%   BMI 26.00 kg/m   Physical Exam Physical Exam  Nursing note and vitals reviewed. Constitutional: Elderly man, non-toxic, and in no mild to moderate respiratory distress Head: Normocephalic and atraumatic.  Mouth/Throat: Oropharynx is clear and dry mucous membranes.  Neck: Normal range of motion. Neck supple.  Cardiovascular: Normal rate and irregularly irregular rhythm.   Pulmonary/Chest: Tachypnea, accessory muscle usage, speaks in short sentences, diffuse rhonchi with expiratory wheezing throughout Abdominal: Soft. There is no tenderness. There is no rebound and no guarding.    Musculoskeletal: Normal range of motion.  trace bilateral lower extremity pedal edema  Neurological: Alert, no facial droop, fluent speech, moves all extremities symmetrically Skin: Skin is warm and dry.  Psychiatric: Cooperative   ED Treatments / Results  Labs (all labs ordered are listed, but only abnormal results are displayed) Labs Reviewed  CBC WITH DIFFERENTIAL/PLATELET - Abnormal; Notable for the following:       Result Value   RBC 3.79 (*)    Hemoglobin 9.6 (*)    HCT 31.3 (*)    MCH 25.3 (*)    RDW 19.9 (*)    Monocytes Absolute 1.6 (*)    All other components within normal limits  BRAIN NATRIURETIC PEPTIDE - Abnormal; Notable for the following:    B Natriuretic Peptide 524.9 (*)    All other components within normal limits  PROTIME-INR - Abnormal; Notable for the following:    Prothrombin Time 18.5 (*)    All other components within normal limits  COMPREHENSIVE METABOLIC PANEL - Abnormal; Notable for the following:    Chloride 99 (*)    Glucose, Bld 111 (*)    Calcium 8.6 (*)    Albumin 3.2 (*)    ALT 15 (*)    GFR calc non Af Amer 60 (*)    All other components within normal limits  TROPONIN I  INFLUENZA PANEL BY PCR (TYPE A & B)    EKG  EKG Interpretation  Date/Time:  Friday March 05 2016 12:08:29 EST Ventricular Rate:  85 PR Interval:    QRS Duration: 95 QT Interval:  388 QTC Calculation: 445 R Axis:   -55 Text Interpretation:  Atrial fibrillation Left anterior fascicular block Probable anteroseptal infarct, old History of atrial fibrillation  Confirmed by Alayziah Tangeman MD, Hiram Mciver [phone]) on 03/05/2016 1:06:57 PM       Radiology Dg Chest Portable 1 View  Result Date: 03/05/2016 CLINICAL DATA:  Cough, congestion and shortness of breath. Wheezing over the last 3 days. EXAM: PORTABLE CHEST 1 VIEW COMPARISON:  10/19/2015 FINDINGS: Mild cardiomegaly. Aortic atherosclerosis. Somewhat poor inspiration. Allowing for that, there appears to be prominence of the  interstitial markings. This could go along with mild fluid overload/ early congestive heart failure. Some question about hazy infiltrates in the lower lobes, but this is not definite. Two-view chest radiography would be useful when able. IMPRESSION: Poor inspiration. Question fluid overload/ early interstitial edema. Also question of hazy lower lobe density that could represent developing pneumonia. Consider two-view radiography when able. Electronically Signed   By: Paulina Fusi M.D.   On: 03/05/2016 12:54    Procedures Procedures (including critical care time) CRITICAL CARE Performed by: Lavera Guise   Total critical care time: 40 minutes  Critical care time was exclusive of separately billable procedures and treating other patients.  Critical care was  necessary to treat or prevent imminent or life-threatening deterioration.  Critical care was time spent personally by me on the following activities: development of treatment plan with patient and/or surrogate as well as nursing, discussions with consultants, evaluation of patient's response to treatment, examination of patient, obtaining history from patient or surrogate, ordering and performing treatments and interventions, ordering and review of laboratory studies, ordering and review of radiographic studies, pulse oximetry and re-evaluation of patient's condition.  Medicatio0ns Ordered in ED Medications  azithromycin (ZITHROMAX) 500 mg in dextrose 5 % 250 mL IVPB (not administered)  furosemide (LASIX) injection 40 mg (not administered)  ipratropium-albuterol (DUONEB) 0.5-2.5 (3) MG/3ML nebulizer solution 3 mL (3 mLs Nebulization Given 03/05/16 1229)  albuterol (PROVENTIL) (2.5 MG/3ML) 0.083% nebulizer solution 2.5 mg (2.5 mg Nebulization Given 03/05/16 1229)  methylPREDNISolone sodium succinate (SOLU-MEDROL) 125 mg/2 mL injection 125 mg (125 mg Intravenous Given 03/05/16 1236)  albuterol (PROVENTIL) (2.5 MG/3ML) 0.083% nebulizer solution (5  mg  Given 03/05/16 1305)  cefTRIAXone (ROCEPHIN) 1 g in dextrose 5 % 50 mL IVPB (1 g Intravenous New Bag/Given 03/05/16 1342)  0.9 %  sodium chloride infusion ( Intravenous New Bag/Given 03/05/16 1350)     Initial Impression / Assessment and Plan / ED Course  I have reviewed the triage vital signs and the nursing notes.  Pertinent labs & imaging results that were available during my care of the patient were reviewed by me and considered in my medical decision making (see chart for details).     Presented with acute hypoxic respiratory failure in the setting of potential COPD exacerbation versus CHF exacerbation. Significantly increased work of breathing on presentation with hypoxia and SPO2 79-80% on room air. Rhonchorous and wheezing on lung exam. Mildly fluid overloaded with trace lower extremity edema and abdominal fullness.  Placed on BiPAP on the initial evaluation. Received breathing treatments and Solu-Medrol for potential COPD exacerbation. Chest x-ray visualized nausea shows potential interstitial edema and questionable early lobar pneumonia. His BNP is elevated and in the 500s, and he has a normal troponin with nonischemic EKG showing known A. fib. Received 40 mg of IV Lasix for potential CHF exacerbation as well. Given questionable pneumonia was covered empirically with ceftriaxone and azithromycin. On Reevaluation, work of breathing significantly improved on BiPAP.  Discussed with hospitalist service, who will admit for ongoing management. Accepted by Dr. Adrian Blackwater.  Final Clinical Impressions(s) / ED Diagnoses   Final diagnoses:  Acute respiratory failure with hypoxia (HCC)  Acute exacerbation of chronic obstructive pulmonary disease (COPD) (HCC)  Acute on chronic congestive heart failure, unspecified congestive heart failure type (HCC)  Lobar pneumonia (HCC)    New Prescriptions New Prescriptions   No medications on file     Lavera Guise, MD 03/05/16 1432

## 2016-03-06 ENCOUNTER — Inpatient Hospital Stay (HOSPITAL_COMMUNITY): Payer: Medicare Other

## 2016-03-06 ENCOUNTER — Other Ambulatory Visit (HOSPITAL_COMMUNITY): Payer: Medicare Other

## 2016-03-06 DIAGNOSIS — I509 Heart failure, unspecified: Secondary | ICD-10-CM

## 2016-03-06 DIAGNOSIS — I482 Chronic atrial fibrillation, unspecified: Secondary | ICD-10-CM | POA: Diagnosis present

## 2016-03-06 DIAGNOSIS — G9340 Encephalopathy, unspecified: Secondary | ICD-10-CM

## 2016-03-06 DIAGNOSIS — Z006 Encounter for examination for normal comparison and control in clinical research program: Secondary | ICD-10-CM

## 2016-03-06 DIAGNOSIS — J101 Influenza due to other identified influenza virus with other respiratory manifestations: Secondary | ICD-10-CM | POA: Diagnosis present

## 2016-03-06 DIAGNOSIS — I10 Essential (primary) hypertension: Secondary | ICD-10-CM

## 2016-03-06 DIAGNOSIS — J111 Influenza due to unidentified influenza virus with other respiratory manifestations: Secondary | ICD-10-CM

## 2016-03-06 LAB — COMPREHENSIVE METABOLIC PANEL
ALBUMIN: 2.5 g/dL — AB (ref 3.5–5.0)
ALT: 14 U/L — ABNORMAL LOW (ref 17–63)
ANION GAP: 9 (ref 5–15)
AST: 27 U/L (ref 15–41)
Alkaline Phosphatase: 46 U/L (ref 38–126)
BUN: 21 mg/dL — ABNORMAL HIGH (ref 6–20)
CO2: 30 mmol/L (ref 22–32)
Calcium: 8 mg/dL — ABNORMAL LOW (ref 8.9–10.3)
Chloride: 102 mmol/L (ref 101–111)
Creatinine, Ser: 1.2 mg/dL (ref 0.61–1.24)
GFR calc Af Amer: >60 mL/min (ref >60)
GFR calc non Af Amer: 55 mL/min — ABNORMAL LOW (ref >60)
GLUCOSE: 149 mg/dL — AB (ref 65–99)
POTASSIUM: 3.9 mmol/L (ref 3.5–5.1)
Sodium: 141 mmol/L (ref 135–145)
TOTAL PROTEIN: 6.1 g/dL — AB (ref 6.5–8.1)
Total Bilirubin: 0.2 mg/dL — ABNORMAL LOW (ref 0.3–1.2)

## 2016-03-06 LAB — CBC
HCT: 32.1 % — ABNORMAL LOW (ref 39.0–52.0)
HCT: 29.3 % — ABNORMAL LOW (ref 39.0–52.0)
Hemoglobin: 9.8 g/dL — ABNORMAL LOW (ref 13.0–17.0)
Hemoglobin: 9.1 g/dL — ABNORMAL LOW (ref 13.0–17.0)
MCH: 24.7 pg — ABNORMAL LOW (ref 26.0–34.0)
MCH: 25 pg — ABNORMAL LOW (ref 26.0–34.0)
MCHC: 30.5 g/dL (ref 30.0–36.0)
MCHC: 31.1 g/dL (ref 30.0–36.0)
MCV: 80.9 fL (ref 78.0–100.0)
MCV: 80.5 fL (ref 78.0–100.0)
PLATELETS: 187 10*3/uL (ref 150–400)
PLATELETS: 128 10*3/uL — AB (ref 150–400)
RBC: 3.97 MIL/uL — ABNORMAL LOW (ref 4.22–5.81)
RBC: 3.64 MIL/uL — ABNORMAL LOW (ref 4.22–5.81)
RDW: 18.3 % — AB (ref 11.5–15.5)
RDW: 18.7 % — AB (ref 11.5–15.5)
WBC: 4.3 10*3/uL (ref 4.0–10.5)
WBC: 5.6 10*3/uL (ref 4.0–10.5)

## 2016-03-06 LAB — PROTIME-INR
INR: 1.53
Prothrombin Time: 18.5 seconds — ABNORMAL HIGH (ref 11.4–15.2)

## 2016-03-06 LAB — BASIC METABOLIC PANEL
ANION GAP: 10 (ref 5–15)
BUN: 19 mg/dL (ref 6–20)
CALCIUM: 8.3 mg/dL — AB (ref 8.9–10.3)
CO2: 29 mmol/L (ref 22–32)
CREATININE: 1.38 mg/dL — AB (ref 0.61–1.24)
Chloride: 101 mmol/L (ref 101–111)
GFR calc Af Amer: 54 mL/min — ABNORMAL LOW (ref >60)
GFR, EST NON AFRICAN AMERICAN: 46 mL/min — AB (ref >60)
Glucose, Bld: 202 mg/dL — ABNORMAL HIGH (ref 65–99)
Potassium: 3.9 mmol/L (ref 3.5–5.1)
Sodium: 140 mmol/L (ref 135–145)

## 2016-03-06 LAB — IRON AND TIBC
Iron: 11 ug/dL — ABNORMAL LOW (ref 45–182)
SATURATION RATIOS: 3 % — AB (ref 17.9–39.5)
TIBC: 343 ug/dL (ref 250–450)
UIBC: 332 ug/dL

## 2016-03-06 LAB — FOLATE: Folate: 14.9 ng/mL (ref >5.9)

## 2016-03-06 LAB — ECHOCARDIOGRAM COMPLETE
HEIGHTINCHES: 75 in
Weight: 3033.6 oz

## 2016-03-06 LAB — TROPONIN I

## 2016-03-06 LAB — MAGNESIUM
Magnesium: 1.5 mg/dL — ABNORMAL LOW (ref 1.7–2.4)
Magnesium: 1.5 mg/dL — ABNORMAL LOW (ref 1.7–2.4)

## 2016-03-06 LAB — GLUCOSE, CAPILLARY: Glucose-Capillary: 181 mg/dL — ABNORMAL HIGH (ref 65–99)

## 2016-03-06 LAB — APTT: aPTT: 39 seconds — ABNORMAL HIGH (ref 24–36)

## 2016-03-06 LAB — VITAMIN B12: Vitamin B-12: 310 pg/mL (ref 180–914)

## 2016-03-06 LAB — STREP PNEUMONIAE URINARY ANTIGEN: Strep Pneumo Urinary Antigen: NEGATIVE

## 2016-03-06 MED ORDER — METHYLPREDNISOLONE SODIUM SUCC 125 MG IJ SOLR
60.0000 mg | Freq: Two times a day (BID) | INTRAMUSCULAR | Status: DC
  Administered 2016-03-06 – 2016-03-08 (×4): 60 mg via INTRAVENOUS
  Filled 2016-03-06 (×4): qty 2

## 2016-03-06 MED ORDER — FUROSEMIDE 40 MG PO TABS
40.0000 mg | ORAL_TABLET | Freq: Once | ORAL | Status: AC
  Administered 2016-03-06: 40 mg via ORAL
  Filled 2016-03-06: qty 1

## 2016-03-06 MED ORDER — STUDY MED - FLU-IGIV 500 ML FINAL VOLUME (PI-FEINSTEIN)
500.0000 mL | Freq: Once | Status: AC
  Administered 2016-03-06: 500 mL via INTRAVENOUS
  Filled 2016-03-06: qty 500

## 2016-03-06 MED ORDER — WARFARIN SODIUM 5 MG PO TABS
2.5000 mg | ORAL_TABLET | Freq: Once | ORAL | Status: AC
  Administered 2016-03-06: 2.5 mg via ORAL
  Filled 2016-03-06: qty 1

## 2016-03-06 MED ORDER — WARFARIN SODIUM 4 MG PO TABS
4.0000 mg | ORAL_TABLET | Freq: Once | ORAL | Status: DC
  Filled 2016-03-06: qty 1

## 2016-03-06 MED ORDER — IPRATROPIUM-ALBUTEROL 0.5-2.5 (3) MG/3ML IN SOLN
3.0000 mL | Freq: Four times a day (QID) | RESPIRATORY_TRACT | Status: DC
  Administered 2016-03-06 – 2016-03-08 (×8): 3 mL via RESPIRATORY_TRACT
  Filled 2016-03-06 (×8): qty 3

## 2016-03-06 NOTE — Progress Notes (Signed)
Title: A Randomized, Double-Blind, Placebo-Controlled Dose Ranging Study Evaluating the Safety Pharmacokinetics and Clinical Benefit of FLU-IGIV in Hospitalized Patients with Serious Influenza A infection. IA-001 (ClinicalTrials.gov Identifier: [id number], Protocol No: IA-001, [facility] Protocol #[id number])  RESEARCH SUBJECT. This research study is sponsored by [employer]  ................................................................................................... PI note 03/06/2016   S: daughter Jose Lowery read over consent ICF overnight and had further questions especially about rare side effects. Explained these. Also explained again research is voluntary and concept of "rare" explained. Patient had confusion overnight and is mildly confused again this morning. Concept of LAR consent again explained. She then agreed for Jose Lowery 1932-01-13 to participate in the study. She is fully in knowledge of the ICF. Other elements of ICF process - see our checklist.  Also explained concepts of IRB and DMSB in study procedures and emphasized repeatedly this a voluntary endeavor  I/E criteria - reviewed with daughter.   I then signed ICF. The consent was done by me face to face and this AM Jose Lowery - research nurse joined me  O:  Exam done post consenting - as below  Vitals:   03/06/16 0200 03/06/16 0325 03/06/16 0800 03/06/16 0900  BP: (!) 138/91 (!) 99/54 123/71 111/81  Pulse: 91 90 91 84  Resp: 15 20 (!) 22   Temp:  98.7 F (37.1 C) 98.8 F (37.1 C)   TempSrc:  Oral Oral   SpO2: 100% 100% 100%   Weight:  86 kg (189 lb 9.6 oz)    Height:        General Appearance:   chronically unwell looking male in bed  Head:    Normocephalic, without obvious abnormality, atraumatic  Eyes:    PERRL - on right side. 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 Cornea /conjunctiva - muddy (baseline      Ears:    Normal external ear canals, both ears  Nose:   NG tube - absent  Throat:  ETT  TUBE - absent , OG tube - absent  Neck:   Supple,  No enlargement/tenderness/nodules     Lungs:      Mildly tachypneic RR 24, not paradoxical. Bilateral diffuse wheezing  Chest wall:    No deformity  Heart:    S1 and S2 normal, no murmur,   Abdomen:     Soft, no masses, no organomegaly  Genitalia:    Not done  Rectal:   not done  Extremities:   Extremities- no cyanosis, no clubbing, no edema     Skin:   Intact in exposed areas .     Neurologic:   Mildly confused. Not agitated. Follows simple commands. Moves all 4s    PULMONARY No results for input(s): PHART, PCO2ART, PO2ART, HCO3, TCO2, O2SAT in the last 168 hours.  Invalid input(s): PCO2, PO2  CBC  Recent Labs Lab 03/05/16 1225 03/06/16 0207 03/06/16 0556  HGB 9.6* 9.8* 9.1*  HCT 31.3* 32.1* 29.3*  WBC 5.0 4.3 5.6  PLT 180 187 128*    COAGULATION  Recent Labs Lab 03/05/16 1340 03/06/16 0207  INR 1.52 1.53    CARDIAC   Recent Labs Lab 03/05/16 1225 03/05/16 2002 03/06/16 0207 03/06/16 0556  TROPONINI <0.03 <0.03 <0.03 <0.03   No results for input(s): PROBNP in the last 168 hours.   CHEMISTRY  Recent Labs Lab 03/05/16 1340 03/06/16 0207 03/06/16 0556  NA 136 140 141  K 3.7 3.9 3.9  CL 99* 101 102  CO2 31 29 30   GLUCOSE 111*  202* 149*  BUN 15 19 21*  CREATININE 1.12 1.38* 1.20  CALCIUM 8.6* 8.3* 8.0*  MG  --  1.5* 1.5*   Estimated Creatinine Clearance: 57.7 mL/min (by C-G formula based on SCr of 1.2 mg/dL).   LIVER  Recent Labs Lab 03/05/16 1340 03/06/16 0207 03/06/16 0556  AST 32  --  27  ALT 15*  --  14*  ALKPHOS 56  --  46  BILITOT 0.8  --  0.2*  PROT 7.5  --  6.1*  ALBUMIN 3.2*  --  2.5*  INR 1.52 1.53  --      INFECTIOUS No results for input(s): LATICACIDVEN, PROCALCITON in the last 168 hours.   ENDOCRINE CBG (last 3)  No results for input(s): GLUCAP in the last 72 hours.       IMAGING x48h  - image(s) personally visualized  -   highlighted in bold Dg Chest  Port 1 View  Result Date: 03/06/2016 CLINICAL DATA:  Shortness of breath. EXAM: PORTABLE CHEST 1 VIEW COMPARISON:  03/05/2016 and 10/19/2015. FINDINGS: Trachea is midline. Heart is at the upper limits of normal in size. Mild prominence of the interstitial markings is likely due to technique with some scarring at the lung bases when compared with 10/19/2015. No airspace consolidation or pleural fluid. IMPRESSION: No acute findings. Electronically Signed   By: Leanna BattlesMelinda  Blietz M.D.   On: 03/06/2016 07:06   Dg Chest Portable 1 View  Result Date: 03/05/2016 CLINICAL DATA:  Cough, congestion and shortness of breath. Wheezing over the last 3 days. EXAM: PORTABLE CHEST 1 VIEW COMPARISON:  10/19/2015 FINDINGS: Mild cardiomegaly. Aortic atherosclerosis. Somewhat poor inspiration. Allowing for that, there appears to be prominence of the interstitial markings. This could go along with mild fluid overload/ early congestive heart failure. Some question about hazy infiltrates in the lower lobes, but this is not definite. Two-view chest radiography would be useful when able. IMPRESSION: Poor inspiration. Question fluid overload/ early interstitial edema. Also question of hazy lower lobe density that could represent developing pneumonia. Consider two-view radiography when able. Electronically Signed   By: Paulina FusiMark  Shogry M.D.   On: 03/05/2016 12:54     A/P Acute respiratory failure with hypoxia (HCC) Rx per Standard of care MD. Currently needing O2. This is due to Flu A  Acute encephalopathy New since admit Related to Acute illnesss due to FluA Mild  [facility]  Influenza A Primary problem On Tamiflu SOC Rx per [facility] MD  Research subject Daughter consent agrees to participate in above research study Exam done post consent  Plan Follow protocol to randomize to higher dose IvIG v lower dose IvIg v placebo Jose Lowery - CRN2 coordinating    Dr. Kalman ShanMurali Kalany Diekmann, M.D., [facility].C.P Pulmonary and Critical  Care Medicine Staff Physician [place] [facility] [place] [facility] Pager: [phone], If no answer or between  15:00h - 7:00h: call [phone]  03/06/2016 11:50 AM

## 2016-03-06 NOTE — Research (Signed)
Title: A Randomized, Double-Blind, Placebo-Controlled Dose Ranging Study Evaluating the Safety Pharmacokinetics and Clinical Benefit of FLU-IGIV in Hospitalized Patients with Serious Influenza A infection. IA-001 (ClinicalTrials.gov Identifier: [id number], Protocol No: IA-001, [facility] #[id number])  RESEARCH SUBJECT. This research study is sponsored by [employer]  ................................................................................................... PI note  I provided 30 minutes of direct supervision for the research infusion for the above protocol   Dr. Kalman ShanMurali Nayelis Bonito, M.D., [facility] Pulmonary and Critical Care Medicine Staff Physician [facility] Pager: [phone], If no answer or between  15:00h - 7:00h: call [phone]  03/06/2016 4:28 PM

## 2016-03-06 NOTE — Progress Notes (Signed)
  Echocardiogram 2D Echocardiogram has been performed.  Cathie BeamsGREGORY, Jakita Dutkiewicz 03/06/2016, 1:49 PM

## 2016-03-06 NOTE — Progress Notes (Signed)
PROGRESS NOTE                                                                                                                                                                                                             Patient Demographics:    Jose Lowery, is a 83 y.o. male, DOB - 09-16-1932, JXB:[id number]  Admit date - 03/05/2016   Admitting Physician Willaim Bane  Outpatient Primary MD for the patient is Patria Mane, MD  LOS - 1  Chief Complaint  Patient presents with  . Shortness of Breath       Brief Narrative With h/o HTN, HLD, CAD s/p stent , was on ticagrelor before, afib on coumadin, copd, not on home o2 or chronic steroids, he presented to [facility] ED due to sob, wheezing, he was found to be hypoxic , o2 sats 79-80% on room air, cxr potential interstitial edema and questionable early lobar pneumonia was admitted with Flu, CHF and COPD exacerbation.   Subjective:    Jose Lowery today has, No headache, No chest pain, No abdominal pain - No Nausea, No new weakness tingling or numbness, No Cough - SOB.    Assessment  & Plan :     1.Acute hypoxic respiratory failure due to influenza A infection was in mild COPD exacerbation and possibly some CHF decompensation as well.  Patient currently on Tamiflu, oxygen, clinically doing better appears nontoxic, PCCM following he is on IVIG test trial, responded well to Lasix given upon admission. Continue supportive care and monitor. Do not think he has superimposed bacterial infection.  2. COPD exacerbation. Minimal wheezing on exam, lower steroid dose, continue azithromycin for now and monitor. Continue oxygen and nebulizer treatments.  3. Acute chronic CHF. No Echo on file, echo pending, received Lasix yesterday. We'll give another small dose Lasix today.  4. Dementia. Hard of hearing. Supportive care, at risk for delirium, minimize narcotics and  benzodiazepines.  5. CAD. No acute issues continue combination of aspirin, statin and Coreg.  6. Dyslipidemia. On statin continue.  7. GERD. On PPI.  8. BPH. Flomax.  9. Chronic A. fib. [place] vasc 2 score of at least 3. Continue Coumadin and beta blocker.    Diet : Diet Heart Room service appropriate? Yes; Fluid consistency: Thin    Family Communication  : None  Code Status :  Full  Disposition Plan  :  TBD  Consults  :  PCCM  Procedures  :   TTE  DVT Prophylaxis  :  Coumadin  Lab Results  Component Value Date   INR 1.53 03/06/2016   INR 1.52 03/05/2016     Lab Results  Component Value Date   PLT 128 (L) 03/06/2016    Inpatient Medications  Scheduled Meds: . aspirin  81 mg Oral Daily  . atorvastatin  80 mg Oral Daily  . azithromycin  500 mg Oral Daily  . carvedilol  12.5 mg Oral BID WC  . furosemide  40 mg Intravenous Daily  . furosemide  40 mg Oral Once  . guaiFENesin  600 mg Oral BID  . methylPREDNISolone (SOLU-MEDROL) injection  60 mg Intravenous Q12H  . oseltamivir  75 mg Oral BID  . pantoprazole  20 mg Oral BID  . sodium chloride flush  3 mL Intravenous Q12H  . tamsulosin  0.4 mg Oral QPC supper  . Warfarin - Pharmacist Dosing Inpatient   Does not apply q1800   Continuous Infusions: PRN Meds:.albuterol, nitroGLYCERIN  Antibiotics  :    Anti-infectives    Start     Dose/Rate Route Frequency Ordered Stop   03/05/16 1930  cefTRIAXone (ROCEPHIN) 1 g in dextrose 5 % 50 mL IVPB  Status:  Discontinued     1 g 100 mL/hr over 30 Minutes Intravenous Every 24 hours 03/05/16 1921 03/06/16 1047   03/05/16 1930  azithromycin (ZITHROMAX) tablet 500 mg     500 mg Oral Daily 03/05/16 1921     03/05/16 1930  oseltamivir (TAMIFLU) capsule 75 mg     75 mg Oral 2 times daily 03/05/16 1921 03/10/16 2159   03/05/16 1446  azithromycin (ZITHROMAX) 500 MG injection    Comments:  Benton, Joss   : cabinet override      03/05/16 1446 03/06/16 0259   03/05/16 1315   cefTRIAXone (ROCEPHIN) 1 g in dextrose 5 % 50 mL IVPB     1 g 100 mL/hr over 30 Minutes Intravenous  Once 03/05/16 1307 03/05/16 1430   03/05/16 1315  azithromycin (ZITHROMAX) 500 mg in dextrose 5 % 250 mL IVPB     500 mg 250 mL/hr over 60 Minutes Intravenous  Once 03/05/16 1307 03/05/16 1900         Objective:   Vitals:   03/06/16 0200 03/06/16 0325 03/06/16 0800 03/06/16 0900  BP: (!) 138/91 (!) 99/54 123/71 111/81  Pulse: 91 90 91 84  Resp: 15 20 (!) 22   Temp:  98.7 F (37.1 C) 98.8 F (37.1 C)   TempSrc:  Oral Oral   SpO2: 100% 100% 100%   Weight:  86 kg (189 lb 9.6 oz)    Height:        Wt Readings from Last 3 Encounters:  03/06/16 86 kg (189 lb 9.6 oz)  10/19/15 86.2 kg (190 lb)     Intake/Output Summary (Last 24 hours) at 03/06/16 1047 Last data filed at 03/06/16 0900  Gross per 24 hour  Intake              343 ml  Output             2650 ml  Net            -2307 ml     Physical Exam  Awake, mildly confused, No new F.N deficits, Normal affect [place].AT,L eye closed chronically Supple Neck,No  JVD, No cervical lymphadenopathy appriciated.  Symmetrical Chest wall movement, Good air movement bilaterally, mild wheezing RRR,No Gallops,Rubs or new Murmurs, No Parasternal Heave +ve B.Sounds, Abd Soft, No tenderness, No organomegaly appriciated, No rebound - guarding or rigidity. No Cyanosis, Clubbing or edema, No new Rash or bruise      Data Review:    CBC  Recent Labs Lab 03/05/16 1225 03/06/16 0207 03/06/16 0556  WBC 5.0 4.3 5.6  HGB 9.6* 9.8* 9.1*  HCT 31.3* 32.1* 29.3*  PLT 180 187 128*  MCV 82.6 80.9 80.5  MCH 25.3* 24.7* 25.0*  MCHC 30.7 30.5 31.1  RDW 19.9* 18.3* 18.7*  LYMPHSABS 1.0  --   --   MONOABS 1.6*  --   --   EOSABS 0.0  --   --   BASOSABS 0.0  --   --     Chemistries   Recent Labs Lab 03/05/16 1340 03/06/16 0207 03/06/16 0556  NA 136 140 141  K 3.7 3.9 3.9  CL 99* 101 102  CO2 31 29 30   GLUCOSE 111* 202* 149*  BUN 15  19 21*  CREATININE 1.12 1.38* 1.20  CALCIUM 8.6* 8.3* 8.0*  MG  --  1.5* 1.5*  AST 32  --  27  ALT 15*  --  14*  ALKPHOS 56  --  46  BILITOT 0.8  --  0.2*   ------------------------------------------------------------------------------------------------------------------ No results for input(s): CHOL, HDL, LDLCALC, TRIG, CHOLHDL, LDLDIRECT in the last 72 hours.  No results found for: HGBA1C ------------------------------------------------------------------------------------------------------------------ No results for input(s): TSH, T4TOTAL, T3FREE, THYROIDAB in the last 72 hours.  Invalid input(s): FREET3 ------------------------------------------------------------------------------------------------------------------  Recent Labs  03/06/16 0207  VITAMINB12 310  FOLATE 14.9  TIBC 343  IRON 11*    Coagulation profile  Recent Labs Lab 03/05/16 1340 03/06/16 0207  INR 1.52 1.53    No results for input(s): DDIMER in the last 72 hours.  Cardiac Enzymes  Recent Labs Lab 03/05/16 2002 03/06/16 0207 03/06/16 0556  TROPONINI <0.03 <0.03 <0.03   ------------------------------------------------------------------------------------------------------------------    Component Value Date/Time   BNP 524.9 (H) 03/05/2016 1225    Micro Results Recent Results (from the past 240 hour(s))  MRSA PCR Screening     Status: None   Collection Time: 03/05/16  7:22 PM  Result Value Ref Range Status   MRSA by PCR NEGATIVE NEGATIVE Final    Comment:        The GeneXpert MRSA Assay (FDA approved for NASAL specimens only), is one component of a comprehensive MRSA colonization surveillance program. It is not intended to diagnose MRSA infection nor to guide or monitor treatment for MRSA infections.     Radiology Reports Dg Chest Port 1 View  Result Date: 03/06/2016 CLINICAL DATA:  Shortness of breath. EXAM: PORTABLE CHEST 1 VIEW COMPARISON:  03/05/2016 and 10/19/2015.  FINDINGS: Trachea is midline. Heart is at the upper limits of normal in size. Mild prominence of the interstitial markings is likely due to technique with some scarring at the lung bases when compared with 10/19/2015. No airspace consolidation or pleural fluid. IMPRESSION: No acute findings. Electronically Signed   By: Leanna Battles M.D.   On: 03/06/2016 07:06   Dg Chest Portable 1 View  Result Date: 03/05/2016 CLINICAL DATA:  Cough, congestion and shortness of breath. Wheezing over the last 3 days. EXAM: PORTABLE CHEST 1 VIEW COMPARISON:  10/19/2015 FINDINGS: Mild cardiomegaly. Aortic atherosclerosis. Somewhat poor inspiration. Allowing for that, there appears to be prominence of the interstitial markings.  This could go along with mild fluid overload/ early congestive heart failure. Some question about hazy infiltrates in the lower lobes, but this is not definite. Two-view chest radiography would be useful when able. IMPRESSION: Poor inspiration. Question fluid overload/ early interstitial edema. Also question of hazy lower lobe density that could represent developing pneumonia. Consider two-view radiography when able. Electronically Signed   By: Paulina FusiMark  Shogry M.D.   On: 03/05/2016 12:54    Time Spent in minutes  30   Rajiv Parlato K M.D on 03/06/2016 at 10:47 AM  Between 7am to 7pm - Pager - [phone]  After 7pm go to [email or web address] - password [email or web address]  [facility] -  Office  [phone]

## 2016-03-06 NOTE — Evaluation (Signed)
Physical Therapy Evaluation Patient Details Name: Jose SimmondsRobert Lowery MRN: [id number] DOB: 03/28/1932 Today's Date: 03/06/2016   History of Present Illness  Pt is an 83 y/o male admitted secondary to SOB, wheezing, hypoxia, and +flu. PMH including but not limited to COPD, HTN and hx of MI.  Clinical Impression  Pt presented supine in bed with HOB elevated, awake and willing to participate in therapy session. Prior to admission, pt reported that he was independent with all functional mobility and ADLs. Pt tolerated sitting up at EOB x10 minutes with supervision, while all vital signs remained stable. As therapist was preparing to perform gait training, the IV team entered and therefore transfers and ambulation were deferred until next session. Pt would continue to benefit from skilled physical therapy services at this time while admitted and after d/c to address his below listed limitations in order to improve his overall safety and independence with functional mobility.      Follow Up Recommendations Home health PT;Supervision/Assistance - 24 hour    Equipment Recommendations  Other (comment) (TBD based on progression of mobility)    Recommendations for Other Services       Precautions / Restrictions Precautions Precautions: Fall Restrictions Weight Bearing Restrictions: No      Mobility  Bed Mobility Overal bed mobility: Needs Assistance Bed Mobility: Supine to Sit;Sit to Supine     Supine to sit: Min guard;HOB elevated Sit to supine: Min guard   General bed mobility comments: increased time, min guard for safety  Transfers                 General transfer comment: deferred secondary to IV team entering room  Ambulation/Gait                Stairs            Wheelchair Mobility    Modified Rankin (Stroke Patients Only)       Balance Overall balance assessment: Needs assistance Sitting-balance support: Feet supported Sitting balance-Leahy Scale:  Good Sitting balance - Comments: pt tolerating sitting EOB x10 minutes with supervision                                     Pertinent Vitals/Pain Pain Assessment: No/denies pain    Home Living Family/patient expects to be discharged to:: Private residence Living Arrangements: Children Available Help at Discharge: Family;Available 24 hours/day Type of Home: House Home Access: Stairs to enter   Entergy CorporationEntrance Stairs-Number of Steps: 3 Home Layout: One level Home Equipment: Shower seat;Grab bars - tub/shower      Prior Function Level of Independence: Independent               Hand Dominance        Extremity/Trunk Assessment   Upper Extremity Assessment Upper Extremity Assessment: Overall WFL for tasks assessed    Lower Extremity Assessment Lower Extremity Assessment: Overall WFL for tasks assessed       Communication   Communication: HOH  Cognition Arousal/Alertness: Awake/alert Behavior During Therapy: WFL for tasks assessed/performed Overall Cognitive Status: No family/caregiver present to determine baseline cognitive functioning Area of Impairment: Safety/judgement;Problem solving         Safety/Judgement: Decreased awareness of safety;Decreased awareness of deficits   Problem Solving: Decreased initiation;Requires verbal cues      General Comments      Exercises     Assessment/Plan    PT Assessment Patient needs continued  PT services  PT Problem List Decreased balance;Decreased mobility;Decreased coordination;Decreased cognition;Decreased knowledge of use of DME;Decreased safety awareness;Cardiopulmonary status limiting activity       PT Treatment Interventions DME instruction;Gait training;Stair training;Functional mobility training;Therapeutic activities;Therapeutic exercise;Balance training;Neuromuscular re-education;Cognitive remediation;Patient/family education    PT Goals (Current goals can be found in the Care Plan section)   Acute Rehab PT Goals Patient Stated Goal: return home PT Goal Formulation: With patient Time For Goal Achievement: 03/20/16 Potential to Achieve Goals: Good    Frequency Min 3X/week   Barriers to discharge        Co-evaluation               End of Session Equipment Utilized During Treatment: Oxygen Activity Tolerance: Patient tolerated treatment well Patient left: in bed;with call bell/phone within reach;with bed alarm set Nurse Communication: Mobility status PT Visit Diagnosis: Other abnormalities of gait and mobility (R26.89)         Time: 1610-9604 PT Time Calculation (min) (ACUTE ONLY): 19 min   Charges:   PT Evaluation $PT Eval Moderate Complexity: 1 Procedure     PT G CodesAlessandra Bevels Theia Dezeeuw 03/06/2016, 2:56 PM Deborah Chalk, PT, DPT [phone]

## 2016-03-06 NOTE — Assessment & Plan Note (Signed)
New since admit Related to Acute illnesss due to FluA Mild  [facility]

## 2016-03-06 NOTE — Research (Signed)
Title: A Randomized, Double-Blind, Placebo-Controlled Dose Ranging Study Evaluating the Safety Pharmacokinetics and Clinical Benefit of FLU-IGIV in Hospitalized Patients with Serious Influenza A infection. IA-001 (ClinicalTrials.gov Identifier: [id number], Protocol No: IA-001, [facility] Protocol #[id number])  RESEARCH SUBJECT. This research study is sponsored by [employer]   The investigational product is called NP-025 aka FLU-IGIV or anti-influenza immune globulin intravenous. It is produced from source plasma collected from [place] Transport planner (FDA) licensed plasma collection establishments from healthy donors who have recovered from influenza (convalescent) and/or were vaccinated against seasonal influenza strains. The plasma contains a relatively high concentration of polyclonal antibodies directed against seasonal influenza strains, specifically influenza A strains H1N1 ([place], [place]) and H3N2 ([place]). It is a glycoprotein of 150-160 kilodaltons against Hemagluttinin (HA) and Neuraminidase (NA) surface proteins.   Key Inclusion Criteria: Adult patients; locally determined positive influenza A infection (Rapid Antigen Test or PCR) from a specimen obtained within 2 days prior to randomization; onset of symptoms </= 6 days prior to randomization; experiencing >/= 1 respiratory symptom (cough, sore throat, nasal congestion) and >/= 1 constitutional symptom (headache, myalgia, feverishness or fatigue); NEW score >/=3 at screening; must have an active order for a minimum 5 day course of oseltamivir ('75mg'$ /twice daily).  Key Exclusion Criteria: History of hypersensitivity to blood or plasma products; history of allergy to latex or rubber; pregnancy or lactation; known IgA deficiency; medical conditions for which receipt of a 500 mL volume of IV fluid may be dangerous; a pre-existing condition or use of medication that may place the individual at a  substantially increased risk of thrombosis; anticipated life expectancy < 90 days; confirmed bacterial pneumonia or any concurrent respiratory viral infection that is not influenza A.  Key Randomization Information: Patients will be randomized to receive a single IV administration of FLU-IGIV (NP-025) or placebo (normal saline 0.9% NaCL). Two dose levels are being assessed, each a fixed dose by volume - 10 vials (450 mL) for the high dose and 5 vials (225 mL) for the low dose. Assuming a patient weight range of 60 kg to 100 kg, patients will receive approximately 0.32 - 0.53 g/kg of IgG protein for the high dose and approximately 0.16 - 0.26 g/kg for the low dose, in a single infusion.  Key other data: Side effects with infusion  Incidence Occurrence Side effect  Common 1-15%, typically < 5% First 30 minutes of infusion Back pain, abdominal pain, nausea, vomiting  Common 1-15%, typically < 5% First few minutes to several hours after infusion Chills, fever, headache, myalgia, fatigue  Rare Seconds to several hours after infusion Hypersensitivity reaction (happens with IgA deficiency), flushing, facial swelling, dyspnea, cyanosis, shock, cardiac arrest, pneumonitis, renal failure, aseptic meningitis, hemolysis, viral infection  Rare Seconds to several hours after infusion Thrombosis (at risk patients are those with a history of DVT, arterial thrombosis, multiple cardiovascular risk factors, impaired cardiac output, advanced age, coagulation disorders, prolonged immobilization, use of estrogen, indwelling catheters, known/suspected hyperviscosity)  Recommendation: For patients who are at risk of developing thrombotic events, administer NP-025 at the minimum rate of infusion practicable, not to exceed 2 mL/min. Ensure adequate hydration in patients before administration. Monitor  Rare 1-6 hours after infusion Transfusion related Acute Lung Injury (TRALI)  Rare  Hemolysis (dose related, happens in total >  2g/kg and non-O blood group), severe hemolysis may lead to renal dysfunction/failure.  Recommendation: Check hemoglobin pre- and post 36-96h and 7-10 days transfusion  Rare Several hours to two  days  after infusion Aseptic Meningitis (AMS) (dose related, happens in total > 2g/kg)   NOTE: NP-025 contains 10% maltose - will interfere with glucose measurements if non-glucose specific measurement devices are not used. Results in false high glucose levels can  result in increased insulin -> life threatening hypoglycemia  ................................................................................................... Clinical Research Coordinator / Research RN note : This visit for Subject Jose SimmondsRobert Woolworth with 01/17/1932 on 03/06/2016 for the above protocol is Visit/Encounter # Pre-Screening and is for purpose of Informed Consent and Screening. The consent for this encounter is under Protocol Version  3.0 and is currently IRB approved. LAR expressed interest and consent in participating.  LAR and Subject thanked for participation.  In this visit 03/06/2016 the subject will be evaluated by investigator named Dr. Kalman ShanMurali Ramaswamy, MD. This research coordinator has verified that the investigator is uptodate with  His training.  IA-001 LAR Informed Consent   Subject Name: Jose Lowery  This patient, Jose SimmondsRobert Tiley, has been consented to the above clinical trial according to FDA regulations, GCP guidelines and [facility] [employer]'s SOPs. Because the mental status of this patient was not adequate to give an informed consent nor assent, the informed consent form and study design have been explained to this patient's legally authorized representative, daughter Cipriano BunkerShirley Lindsay, by this Study Coordinator at 10:45am on 03/06/2016. The surrogate demonstrated comprehension of the trial and study requirements/expectations. No study procedures have been initiated before consenting of this patient/surrogate. This surrogate was given  sufficient time for reading the consent form. All risks, benefits and options have been thoroughly discussed and all questions were answered per the surrogate's satisfaction. This patient/surrogate was not coerced in any way to participate in this clinical trial. The surrogate has voluntarily signed consent version 3.0 at 11:31 on 03/06/2016. A copy of the signed consent form was given to the surrogate and a copy was placed in the subject's medical record. This surrogate was thanked for this patient's participation in research and contribution to science.  See the subject's paper research chart for full documentation of the informed consent process.  Dr. Kalman ShanMurali Ramaswamy an active participant in the informed consent process, see his progress note.  LAR was also provided with the study 24hour contact information card.  Signed by Kinnie FeilStacey Phelps, RN, BSN, [facility] Clinical Research Nurse II [facility] Office [phone] [place], [place] 11:55 AM 03/06/2016

## 2016-03-06 NOTE — Research (Signed)
Title: A Randomized, Double-Blind, Placebo-Controlled Dose Ranging Study Evaluating the Safety Pharmacokinetics and Clinical Benefit of FLU-IGIV in Hospitalized Patients with Serious Influenza A infection. IA-001 (ClinicalTrials.gov Identifier: [id number], Protocol No: IA-001, [facility] Protocol #[id number]#20172016)  RESEARCH SUBJECT. This research study is sponsored by [employer]  ...................................................................................................  Clinical Research Coordinator / Research RN note : This visit for Subject Quincy SimmondsRobert Douglass with 10/09/1932 on 03/06/2016 for the above protocol is Visit/Encounter # Baseline/Infusion  and is for purpose of IP administration. The consent for this encounter is under Protocol Version 3.0 and is currently IRB approved.  LAR expressed continued interest and consent in continuing as a study subject. Subject thanked for participation. The purposeor this encounter : Baseline assessments, IP infusion and Post infusion assessments.  1550 - New IV flushed by bedside RN, patient states he has no pain.  Bedside RN educated regarding infusion, sxs to be on alert for. Patient continues to be disoriented but pleasant.  Infusion started after completion of baseline assessments.  Patient continues to have audible wheeze and is tachypneic.  Please see MAR for times of IP infusion titration.  1610- Vitals checked and stable.  IV site continues to be intact, no signs of irritation.  When asked if the patient is feeling okay, he nods in the affirmative.  1625 - Patient continues to affirm he is okay, IV site continues to be intact, vital signs stable.  Rate increased, per protocol.  [id number] - Patient states he is doing well.  IV site continues to be intact.  Rate increased per protocol. Patient continues to be tolerating well.  1712 - IV site intact, patient again nodded in the affirmative when asked if he was feeling okay.  Vital signs stable,  respiratory status at baseline..  Rate increased per protocol.  1730 - Patient continues to report feeling okay, patient tolerating well.    [id number] - Helped patient set up for dinner.  Patient continues with audible wheezing and tachypnea.  IV site intact, patient says he is "okay,"  No pain.  Patient tolerating infusion well.  1836 - No change in patient status, patient tolerating infusion well.  1843 - Infusion complete.  IV flushed, patient tolerated infusion well; continues to be tachypneic and wheeze.  1915 - Post infusion assessments complete.  Patient states he feels "okay," denies any kind of pain.  Respiratory flu symptoms recorded.  Patient also obviously continues to be fatigued, wanting to take a nap.  This RN spoke with night shift bedside nurse during nursing shift report.  Education regarding study, IP and sxs to be alert for as well as local study contact information provided.  All questions answered.   Signed by Kinnie FeilStacey Marialuisa Basara, RN, BSN, [facility] Clinical Research Nurse II [facility] Office [phone] [place], [place] 4:32 PM 03/06/2016

## 2016-03-06 NOTE — Assessment & Plan Note (Signed)
Daughter consent agrees to participate in above research study Exam done post consent  Plan Follow protocol to randomize to higher dose IvIG v lower dose IvIg v placebo Kinnie FeilStacey Phelps - CRN2 coordinating

## 2016-03-06 NOTE — Assessment & Plan Note (Signed)
Rx per Standard of care MD. Currently needing O2. This is due to Flu A

## 2016-03-06 NOTE — Assessment & Plan Note (Signed)
Primary problem On Tamiflu SOC Rx per [facility]

## 2016-03-06 NOTE — Progress Notes (Addendum)
ANTICOAGULATION CONSULT NOTE - Follow Up Consult  Pharmacy Consult for Warfarin Indication: atrial fibrillation  No Known Allergies  Patient Measurements: Height: 6\' 3"  (190.5 cm) Weight: 189 lb 9.6 oz (86 kg) IBW/kg (Calculated) : 84.5  Vital Signs: Temp: 98.8 F (37.1 C) (02/24 0800) Temp Source: Oral (02/24 0800) BP: 111/81 (02/24 0900) Pulse Rate: 84 (02/24 0900)  Labs:  Recent Labs  03/05/16 1225 03/05/16 1340 03/05/16 2002 03/06/16 0207 03/06/16 0556  HGB 9.6*  --   --  9.8* 9.1*  HCT 31.3*  --   --  32.1* 29.3*  PLT 180  --   --  187 128*  LABPROT  --  18.5*  --  18.5*  --   INR  --  1.52  --  1.53  --   CREATININE  --  1.12  --  1.38* 1.20  TROPONINI <0.03  --  <0.03 <0.03 <0.03    Estimated Creatinine Clearance: 57.7 mL/min (by C-G formula based on SCr of 1.2 mg/dL).   Medical History: Past Medical History:  Diagnosis Date  . COPD (chronic obstructive pulmonary disease) (HCC)   . Hyperlipidemia   . Hypertension   . Myocardial infarction   . Prosthetic eye globe    left    Assessment: Jose Lowery with Hx Afib and on warfarin PTA, admited with flu+.  Admit INR 1.52, only up to 1.53 today. Hgb low stable, Plt down 128, no bleeding documented.  PTA dose: 2mg  daily  Goal of Therapy:  INR 2-3 Monitor platelets by anticoagulation protocol: Yes   Plan:  Warfarin 2.5mg  PO tonight Daily INR Monitor CBC, s/sx of bleeding   Mackie Paienee Aira Sallade, PharmD PGY1 Pharmacy Resident Pager: [phone] 03/06/2016 3:25 PM

## 2016-03-07 DIAGNOSIS — J101 Influenza due to other identified influenza virus with other respiratory manifestations: Secondary | ICD-10-CM

## 2016-03-07 LAB — COMPREHENSIVE METABOLIC PANEL
ALBUMIN: 2.6 g/dL — AB (ref 3.5–5.0)
ALT: 17 U/L (ref 17–63)
AST: 35 U/L (ref 15–41)
Alkaline Phosphatase: 51 U/L (ref 38–126)
Anion gap: 7 (ref 5–15)
BUN: 21 mg/dL — ABNORMAL HIGH (ref 6–20)
CHLORIDE: 99 mmol/L — AB (ref 101–111)
CO2: 32 mmol/L (ref 22–32)
Calcium: 8.6 mg/dL — ABNORMAL LOW (ref 8.9–10.3)
Creatinine, Ser: 1.08 mg/dL (ref 0.61–1.24)
GFR calc non Af Amer: >60 mL/min (ref >60)
GLUCOSE: 161 mg/dL — AB (ref 65–99)
Potassium: 3.9 mmol/L (ref 3.5–5.1)
SODIUM: 138 mmol/L (ref 135–145)
Total Bilirubin: 0.5 mg/dL (ref 0.3–1.2)
Total Protein: 6.7 g/dL (ref 6.5–8.1)

## 2016-03-07 LAB — CBC WITH DIFFERENTIAL/PLATELET
Basophils Absolute: 0 10*3/uL (ref 0.0–0.1)
Basophils Relative: 0 %
Eosinophils Absolute: 0 10*3/uL (ref 0.0–0.7)
Eosinophils Relative: 0 %
HEMATOCRIT: 33.5 % — AB (ref 39.0–52.0)
HEMOGLOBIN: 10.3 g/dL — AB (ref 13.0–17.0)
LYMPHS ABS: 0.8 10*3/uL (ref 0.7–4.0)
LYMPHS PCT: 5 %
MCH: 24.8 pg — AB (ref 26.0–34.0)
MCHC: 30.7 g/dL (ref 30.0–36.0)
MCV: 80.7 fL (ref 78.0–100.0)
Monocytes Absolute: 1.1 10*3/uL — ABNORMAL HIGH (ref 0.1–1.0)
Monocytes Relative: 7 %
NEUTROS ABS: 13.7 10*3/uL — AB (ref 1.7–7.7)
NEUTROS PCT: 88 %
PLATELETS: 155 10*3/uL (ref 150–400)
RBC: 4.15 MIL/uL — AB (ref 4.22–5.81)
RDW: 18.4 % — ABNORMAL HIGH (ref 11.5–15.5)
WBC: 15.6 10*3/uL — AB (ref 4.0–10.5)

## 2016-03-07 LAB — PROTIME-INR
INR: 1.62
INR: 1.46
PROTHROMBIN TIME: 19.4 s — AB (ref 11.4–15.2)
Prothrombin Time: 17.9 seconds — ABNORMAL HIGH (ref 11.4–15.2)

## 2016-03-07 LAB — APTT: aPTT: 34 seconds (ref 24–36)

## 2016-03-07 MED ORDER — WARFARIN SODIUM 5 MG PO TABS
2.5000 mg | ORAL_TABLET | Freq: Once | ORAL | Status: AC
  Administered 2016-03-07: 2.5 mg via ORAL
  Filled 2016-03-07: qty 1

## 2016-03-07 MED ORDER — FUROSEMIDE 10 MG/ML IJ SOLN
40.0000 mg | Freq: Two times a day (BID) | INTRAMUSCULAR | Status: DC
  Administered 2016-03-07 – 2016-03-08 (×3): 40 mg via INTRAVENOUS
  Filled 2016-03-07 (×3): qty 4

## 2016-03-07 MED ORDER — MAGNESIUM SULFATE 2 GM/50ML IV SOLN
2.0000 g | Freq: Once | INTRAVENOUS | Status: AC
  Administered 2016-03-07: 2 g via INTRAVENOUS
  Filled 2016-03-07: qty 50

## 2016-03-07 NOTE — Progress Notes (Signed)
03/07/2016 Patient had bladder scan at 1030 he had 285cc. Keisha (NT) reported patient had 100cc in urinal before bladder scan. Patient also had urine  with bowel movement. Order was given for in and out cath times one, but was unable to do in and out cath. Dr Thedore MinsSingh was made aware via Amion. Rn reported patient had 600 cc for urine from 0818 to 12:08pm. Lovie MacadamiaNadine Arnella Pralle RN.

## 2016-03-07 NOTE — Research (Signed)
Title: A Randomized, Double-Blind, Placebo-Controlled Dose Ranging Study Evaluating the Safety Pharmacokinetics and Clinical Benefit of FLU-IGIV in Hospitalized Patients with Serious Influenza A infection. IA-001 (ClinicalTrials.gov Identifier: [id number], Protocol No: IA-001, [facility] Protocol #[id number]#20172016)  RESEARCH SUBJECT. This research study is sponsored by [employer] ...................................................................................................  Clinical Research Coordinator / Research RN note : This visit for Subject Jose Lowery with 09/24/1932 on 03/07/2016 for the above protocol is Visit/Encounter # Day 2  and is for purpose of Day 2 assessments. The consent for this encounter is under Protocol Version 3.0 and is currently IRB approved. Subject expressed continued interest and consent in continuing as a study subject. Subject thanked for participation. The purpose or this encounter : Day 2 procedures and assessments.  Patient still with cough, wheezing and tachypnea today, requiring supplemental oxygen (lower level).  Patient seems more alert and is more interactive this afternoon, still not fully oriented.  Patient denies hurting or pain; he says he feels good.  Vitals, blood and NP samples collected per protocol.  No evidence of an adverse event.  See MAR for concomitant medications.  1712 Update:  Trialed patient off of supplemental oxygen per Dr. Marchelle Gearingamaswamy; patient able to maintain oxygen saturation >92% on room air for 20 minutes.  About 8 minutes of which patient was on phone speaking with daughter.  This RN spoke with daughter and updated on patient status.  Daughter Talbert ForestShirley to visit patient tomorrow.  Patient verbalized understanding.    Because patient maintaining oxygen saturations about 95-96% without increased work of breathing (ongoing tachypnea and wheeze), oxygen remained off.  PI Dr. Marchelle Gearingamaswamy updated.  Bedside RN updated.  Signed by Kinnie FeilStacey  Phelps, RN, BSN, [facility] Clinical Research Nurse II [facility] Office [phone] [place], [place] 3:33 PM 03/07/2016

## 2016-03-07 NOTE — Progress Notes (Signed)
ANTICOAGULATION CONSULT NOTE - Follow Up Consult  Pharmacy Consult for Warfarin Indication: atrial fibrillation  No Known Allergies  Patient Measurements: Height: 6\' 3"  (190.5 cm) Weight: 189 lb 9.6 oz (86 kg) IBW/kg (Calculated) : 84.5  Vital Signs: Temp: 98.6 F (37 C) (02/25 0800) Temp Source: Oral (02/25 0800) BP: 115/74 (02/25 0500) Pulse Rate: 80 (02/25 0500)  Labs:  Recent Labs  03/05/16 1225 03/05/16 1340 03/05/16 2002 03/06/16 0207 03/06/16 0556 03/06/16 1430 03/07/16 0329  HGB 9.6*  --   --  9.8* 9.1*  --   --   HCT 31.3*  --   --  32.1* 29.3*  --   --   PLT 180  --   --  187 128*  --   --   APTT  --   --   --   --   --  39*  --   LABPROT  --  18.5*  --  18.5*  --   --  19.4*  INR  --  1.52  --  1.53  --   --  1.62  CREATININE  --  1.12  --  1.38* 1.20  --   --   TROPONINI <0.03  --  <0.03 <0.03 <0.03  --   --     Estimated Creatinine Clearance: 57.7 mL/min (by C-G formula based on SCr of 1.2 mg/dL).   Medical History: Past Medical History:  Diagnosis Date  . COPD (chronic obstructive pulmonary disease) (HCC)   . Hyperlipidemia   . Hypertension   . Myocardial infarction   . Prosthetic eye globe    left    Assessment: Jose Lowery with Hx Afib and on warfarin PTA, admited with flu+.  INR remains subtherapeutic at 1.62 today. No CBC today, no bleeding documented.  PTA dose: 2mg  daily  Goal of Therapy:  INR 2-3 Monitor platelets by anticoagulation protocol: Yes   Plan:  Warfarin 2.5mg  PO tonight Daily INR Monitor CBC, s/sx of bleeding   Mackie Paienee Eulene Pekar, PharmD PGY1 Pharmacy Resident Pager: [phone] 03/07/2016 10:49 AM

## 2016-03-07 NOTE — Progress Notes (Signed)
PROGRESS NOTE                                                                                                                                                                                                             Patient Demographics:    Jose Lowery, is a 83 y.o. male, DOB - 01-11-1933, ZOX:[id number]  Admit date - 03/05/2016   Admitting Physician Willaim Bane  Outpatient Primary MD for the patient is Patria Mane, MD  LOS - 2  Chief Complaint  Patient presents with  . Shortness of Breath       Brief Narrative With h/o HTN, HLD, CAD s/p stent , was on ticagrelor before, afib on coumadin, copd, not on home o2 or chronic steroids, he presented to [facility] ED due to sob, wheezing, he was found to be hypoxic , o2 sats 79-80% on room air, cxr potential interstitial edema and questionable early lobar pneumonia was admitted with Flu, CHF and COPD exacerbation.   Subjective:    Jose Lowery today has, No headache, No chest pain, No abdominal pain - No Nausea, No new weakness tingling or numbness, No Cough - SOB.    Assessment  & Plan :     1.Acute hypoxic respiratory failure due to influenza A infection was in mild COPD exacerbation and possibly some CHF decompensation as well.  Patient currently on Tamiflu, oxygen, clinically doing better appears nontoxic, PCCM following he is on IVIG test trial and got the test medicine, responded well to Lasix given upon admission. Continue supportive care and monitor. Do not think he has superimposed bacterial infection, Continue supportive care, increase activity and have  added Flutter valve.  2. COPD exacerbation. Minimal wheezing on exam, lower steroid dose, continue azithromycin for now and monitor. Continue oxygen and nebulizer treatments.  3. Acute chronic CHF. No Echo on file, echo still pending, received Lasix yesterday. We'll give another small dose Lasix  again today.  4. Dementia. Hard of hearing. Supportive care, at risk for delirium, minimize narcotics and benzodiazepines.  5. CAD. No acute issues continue combination of aspirin, statin and Coreg.  6. Dyslipidemia. On statin continue.  7. GERD. On PPI.  8. BPH. Flomax.  9. Chronic A. fib. [place] vasc 2 score of at least 3. Continue Coumadin and beta blocker.    Diet :  Diet Heart Room service appropriate? Yes; Fluid consistency: Thin    Family Communication  : None  Code Status :  Full  Disposition Plan  :  TBD  Consults  :  PCCM  Procedures  :   TTE  DVT Prophylaxis  :  Coumadin  Lab Results  Component Value Date   INR 1.62 03/07/2016   INR 1.53 03/06/2016   INR 1.52 03/05/2016     Lab Results  Component Value Date   PLT 128 (L) 03/06/2016    Inpatient Medications  Scheduled Meds: . aspirin  81 mg Oral Daily  . atorvastatin  80 mg Oral Daily  . azithromycin  500 mg Oral Daily  . carvedilol  12.5 mg Oral BID WC  . furosemide  40 mg Intravenous Q12H  . guaiFENesin  600 mg Oral BID  . ipratropium-albuterol  3 mL Nebulization QID  . methylPREDNISolone (SOLU-MEDROL) injection  60 mg Intravenous Q12H  . oseltamivir  75 mg Oral BID  . pantoprazole  20 mg Oral BID  . sodium chloride flush  3 mL Intravenous Q12H  . tamsulosin  0.4 mg Oral QPC supper  . Warfarin - Pharmacist Dosing Inpatient   Does not apply q1800   Continuous Infusions: PRN Meds:.albuterol, nitroGLYCERIN  Antibiotics  :    Anti-infectives    Start     Dose/Rate Route Frequency Ordered Stop   03/05/16 1930  cefTRIAXone (ROCEPHIN) 1 g in dextrose 5 % 50 mL IVPB  Status:  Discontinued     1 g 100 mL/hr over 30 Minutes Intravenous Every 24 hours 03/05/16 1921 03/06/16 1047   03/05/16 1930  azithromycin (ZITHROMAX) tablet 500 mg     500 mg Oral Daily 03/05/16 1921     03/05/16 1930  oseltamivir (TAMIFLU) capsule 75 mg     75 mg Oral 2 times daily 03/05/16 1921 03/10/16 2159   03/05/16  1446  azithromycin (ZITHROMAX) 500 MG injection    Comments:  Benton, Joss   : cabinet override      03/05/16 1446 03/06/16 0259   03/05/16 1315  cefTRIAXone (ROCEPHIN) 1 g in dextrose 5 % 50 mL IVPB     1 g 100 mL/hr over 30 Minutes Intravenous  Once 03/05/16 1307 03/05/16 1430   03/05/16 1315  azithromycin (ZITHROMAX) 500 mg in dextrose 5 % 250 mL IVPB     500 mg 250 mL/hr over 60 Minutes Intravenous  Once 03/05/16 1307 03/05/16 1900         Objective:   Vitals:   03/07/16 0400 03/07/16 0500 03/07/16 0800 03/07/16 0831  BP: 120/83 115/74    Pulse: 74 80    Resp: 17 16    Temp:   98.6 F (37 C)   TempSrc:   Oral   SpO2: 100% 100%  99%  Weight:      Height:        Wt Readings from Last 3 Encounters:  03/06/16 86 kg (189 lb 9.6 oz)  10/19/15 86.2 kg (190 lb)     Intake/Output Summary (Last 24 hours) at 03/07/16 1019 Last data filed at 03/07/16 0817  Gross per 24 hour  Intake             1220 ml  Output             2475 ml  Net            -1255 ml     Physical Exam  Awake, mildly confused, No  new F.N deficits, Normal affect [place].AT,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  Symmetrical Chest wall movement, Good air movement bilaterally, mild wheezing RRR,No Gallops,Rubs or new Murmurs, No Parasternal Heave +ve B.Sounds, Abd Soft, No tenderness, No organomegaly appriciated, No rebound - guarding or rigidity. No Cyanosis, Clubbing , trace edema, No new Rash or bruise      Data Review:    CBC  Recent Labs Lab 03/05/16 1225 03/06/16 0207 03/06/16 0556  WBC 5.0 4.3 5.6  HGB 9.6* 9.8* 9.1*  HCT 31.3* 32.1* 29.3*  PLT 180 187 128*  MCV 82.6 80.9 80.5  MCH 25.3* 24.7* 25.0*  MCHC 30.7 30.5 31.1  RDW 19.9* 18.3* 18.7*  LYMPHSABS 1.0  --   --   MONOABS 1.6*  --   --   EOSABS 0.0  --   --   BASOSABS 0.0  --   --     Chemistries   Recent Labs Lab 03/05/16 1340 03/06/16 0207  03/06/16 0556  NA 136 140 141  K 3.7 3.9 3.9  CL 99* 101 102  CO2 31 29 30   GLUCOSE 111* 202* 149*  BUN 15 19 21*  CREATININE 1.12 1.38* 1.20  CALCIUM 8.6* 8.3* 8.0*  MG  --  1.5* 1.5*  AST 32  --  27  ALT 15*  --  14*  ALKPHOS 56  --  46  BILITOT 0.8  --  0.2*   ------------------------------------------------------------------------------------------------------------------ No results for input(s): CHOL, HDL, LDLCALC, TRIG, CHOLHDL, LDLDIRECT in the last 72 hours.  No results found for: HGBA1C ------------------------------------------------------------------------------------------------------------------ No results for input(s): TSH, T4TOTAL, T3FREE, THYROIDAB in the last 72 hours.  Invalid input(s): FREET3 ------------------------------------------------------------------------------------------------------------------  Recent Labs  03/06/16 0207  VITAMINB12 310  FOLATE 14.9  TIBC 343  IRON 11*    Coagulation profile  Recent Labs Lab 03/05/16 1340 03/06/16 0207 03/07/16 0329  INR 1.52 1.53 1.62    No results for input(s): DDIMER in the last 72 hours.  Cardiac Enzymes  Recent Labs Lab 03/05/16 2002 03/06/16 0207 03/06/16 0556  TROPONINI <0.03 <0.03 <0.03   ------------------------------------------------------------------------------------------------------------------    Component Value Date/Time   BNP 524.9 (H) 03/05/2016 1225    Micro Results Recent Results (from the past 240 hour(s))  MRSA PCR Screening     Status: None   Collection Time: 03/05/16  7:22 PM  Result Value Ref Range Status   MRSA by PCR NEGATIVE NEGATIVE Final    Comment:        The GeneXpert MRSA Assay (FDA approved for NASAL specimens only), is one component of a comprehensive MRSA colonization surveillance program. It is not intended to diagnose MRSA infection nor to guide or monitor treatment for MRSA infections.     Radiology Reports Dg Chest Port 1  View  Result Date: 03/06/2016 CLINICAL DATA:  Shortness of breath. EXAM: PORTABLE CHEST 1 VIEW COMPARISON:  03/05/2016 and 10/19/2015. FINDINGS: Trachea is midline. Heart is at the upper limits of normal in size. Mild prominence of the interstitial markings is likely due to technique with some scarring at the lung bases when compared with 10/19/2015. No airspace consolidation or pleural fluid. IMPRESSION: No acute findings. Electronically Signed   By: Leanna Battles M.D.   On: 03/06/2016 07:06   Dg Chest Portable 1 View  Result Date: 03/05/2016 CLINICAL DATA:  Cough, congestion and shortness of breath. Wheezing over the last 3 days. EXAM: PORTABLE CHEST 1 VIEW COMPARISON:  10/19/2015 FINDINGS: Mild cardiomegaly. Aortic atherosclerosis. Somewhat poor inspiration. Allowing for that, there appears to be prominence of the interstitial markings. This could go along with mild fluid overload/ early congestive heart failure. Some question about hazy infiltrates in the lower lobes, but this is not definite. Two-view chest radiography would be useful when able. IMPRESSION: Poor inspiration. Question fluid overload/ early interstitial edema. Also question of hazy lower lobe density that could represent developing pneumonia. Consider two-view radiography when able. Electronically Signed   By: Paulina FusiMark  Shogry M.D.   On: 03/05/2016 12:54    Time Spent in minutes  30   Marcella Charlson K M.D on 03/07/2016 at 10:19 AM  Between 7am to 7pm - Pager - [phone]  After 7pm go to [email or web address] - password [email or web address]  [facility] -  Office  [phone]

## 2016-03-08 ENCOUNTER — Telehealth: Payer: Self-pay

## 2016-03-08 ENCOUNTER — Telehealth: Payer: Self-pay | Admitting: Adult Health

## 2016-03-08 ENCOUNTER — Inpatient Hospital Stay (HOSPITAL_COMMUNITY): Payer: Medicare Other

## 2016-03-08 DIAGNOSIS — Z006 Encounter for examination for normal comparison and control in clinical research program: Secondary | ICD-10-CM

## 2016-03-08 LAB — CBC
HCT: 33.5 % — ABNORMAL LOW (ref 39.0–52.0)
Hemoglobin: 10.5 g/dL — ABNORMAL LOW (ref 13.0–17.0)
MCH: 25.1 pg — ABNORMAL LOW (ref 26.0–34.0)
MCHC: 31.3 g/dL (ref 30.0–36.0)
MCV: 80.1 fL (ref 78.0–100.0)
PLATELETS: 149 10*3/uL — AB (ref 150–400)
RBC: 4.18 MIL/uL — ABNORMAL LOW (ref 4.22–5.81)
RDW: 19 % — ABNORMAL HIGH (ref 11.5–15.5)
WBC: 13.7 10*3/uL — AB (ref 4.0–10.5)

## 2016-03-08 LAB — CBC WITH DIFFERENTIAL/PLATELET
BASOS ABS: 0 10*3/uL (ref 0.0–0.1)
BASOS PCT: 0 %
EOS ABS: 0 10*3/uL (ref 0.0–0.7)
EOS PCT: 0 %
HCT: 40.3 % (ref 39.0–52.0)
Hemoglobin: 12.4 g/dL — ABNORMAL LOW (ref 13.0–17.0)
Lymphocytes Relative: 6 %
Lymphs Abs: 1 10*3/uL (ref 0.7–4.0)
MCH: 24.8 pg — ABNORMAL LOW (ref 26.0–34.0)
MCHC: 30.8 g/dL (ref 30.0–36.0)
MCV: 80.8 fL (ref 78.0–100.0)
Monocytes Absolute: 0.9 10*3/uL (ref 0.1–1.0)
Monocytes Relative: 6 %
NEUTROS PCT: 88 %
Neutro Abs: 13.1 10*3/uL — ABNORMAL HIGH (ref 1.7–7.7)
PLATELETS: 172 10*3/uL (ref 150–400)
RBC: 4.99 MIL/uL (ref 4.22–5.81)
RDW: 19.1 % — ABNORMAL HIGH (ref 11.5–15.5)
WBC: 14.9 10*3/uL — AB (ref 4.0–10.5)

## 2016-03-08 LAB — COMPREHENSIVE METABOLIC PANEL
ALBUMIN: 2.9 g/dL — AB (ref 3.5–5.0)
ALK PHOS: 53 U/L (ref 38–126)
ALT: 21 U/L (ref 17–63)
ANION GAP: 8 (ref 5–15)
AST: 38 U/L (ref 15–41)
BILIRUBIN TOTAL: 0.8 mg/dL (ref 0.3–1.2)
BUN: 19 mg/dL (ref 6–20)
CALCIUM: 8.9 mg/dL (ref 8.9–10.3)
CO2: 38 mmol/L — ABNORMAL HIGH (ref 22–32)
Chloride: 95 mmol/L — ABNORMAL LOW (ref 101–111)
Creatinine, Ser: 1.09 mg/dL (ref 0.61–1.24)
GFR calc non Af Amer: >60 mL/min (ref >60)
GLUCOSE: 123 mg/dL — AB (ref 65–99)
POTASSIUM: 3.8 mmol/L (ref 3.5–5.1)
Sodium: 141 mmol/L (ref 135–145)
TOTAL PROTEIN: 7.8 g/dL (ref 6.5–8.1)

## 2016-03-08 LAB — PROTIME-INR
INR: 1.58
Prothrombin Time: 19 seconds — ABNORMAL HIGH (ref 11.4–15.2)

## 2016-03-08 LAB — BASIC METABOLIC PANEL
ANION GAP: 7 (ref 5–15)
BUN: 22 mg/dL — ABNORMAL HIGH (ref 6–20)
CALCIUM: 8.3 mg/dL — AB (ref 8.9–10.3)
CO2: 35 mmol/L — ABNORMAL HIGH (ref 22–32)
CREATININE: 1.04 mg/dL (ref 0.61–1.24)
Chloride: 97 mmol/L — ABNORMAL LOW (ref 101–111)
GLUCOSE: 133 mg/dL — AB (ref 65–99)
Potassium: 3.6 mmol/L (ref 3.5–5.1)
Sodium: 139 mmol/L (ref 135–145)

## 2016-03-08 LAB — APTT: APTT: 31 s (ref 24–36)

## 2016-03-08 LAB — MAGNESIUM: Magnesium: 1.6 mg/dL — ABNORMAL LOW (ref 1.7–2.4)

## 2016-03-08 MED ORDER — PREDNISONE 5 MG PO TABS: ORAL_TABLET | ORAL | 0 refills | Status: DC

## 2016-03-08 MED ORDER — WARFARIN SODIUM 3 MG PO TABS
3.0000 mg | ORAL_TABLET | Freq: Once | ORAL | Status: DC
  Filled 2016-03-08: qty 1

## 2016-03-08 MED ORDER — IPRATROPIUM BROMIDE 0.02 % IN SOLN: 0.5000 mg | Freq: Four times a day (QID) | RESPIRATORY_TRACT | 0 refills | Status: DC

## 2016-03-08 MED ORDER — FUROSEMIDE 40 MG PO TABS: 40.0000 mg | ORAL_TABLET | Freq: Every day | ORAL | 0 refills | Status: DC

## 2016-03-08 MED ORDER — MAGNESIUM OXIDE 400 (241.3 MG) MG PO TABS: 400.0000 mg | ORAL_TABLET | Freq: Every day | ORAL | 0 refills | Status: DC

## 2016-03-08 MED ORDER — POTASSIUM CHLORIDE CRYS ER 20 MEQ PO TBCR: 40.0000 meq | EXTENDED_RELEASE_TABLET | Freq: Every day | ORAL | 0 refills | Status: DC

## 2016-03-08 MED ORDER — FUROSEMIDE 10 MG/ML IJ SOLN: 80.0000 mg | Freq: Every day | INTRAMUSCULAR | Status: DC

## 2016-03-08 MED ORDER — METOLAZONE 10 MG PO TABS
5.0000 mg | ORAL_TABLET | Freq: Once | ORAL | Status: AC
  Administered 2016-03-08: 5 mg via ORAL
  Filled 2016-03-08: qty 1

## 2016-03-08 MED ORDER — POTASSIUM CHLORIDE CRYS ER 20 MEQ PO TBCR
40.0000 meq | EXTENDED_RELEASE_TABLET | Freq: Four times a day (QID) | ORAL | Status: DC
  Administered 2016-03-08: 40 meq via ORAL
  Filled 2016-03-08: qty 2

## 2016-03-08 MED ORDER — ALBUTEROL SULFATE (2.5 MG/3ML) 0.083% IN NEBU: 2.5000 mg | INHALATION_SOLUTION | Freq: Four times a day (QID) | RESPIRATORY_TRACT | 0 refills | Status: DC | PRN

## 2016-03-08 MED ORDER — OSELTAMIVIR PHOSPHATE 75 MG PO CAPS: 75.0000 mg | ORAL_CAPSULE | Freq: Two times a day (BID) | ORAL | 0 refills | Status: DC

## 2016-03-08 MED ORDER — MAGNESIUM SULFATE 4 GM/100ML IV SOLN
4.0000 g | Freq: Once | INTRAVENOUS | Status: AC
  Administered 2016-03-08: 4 g via INTRAVENOUS
  Filled 2016-03-08: qty 100

## 2016-03-08 NOTE — Care Management Important Message (Signed)
Important Message  Patient Details  Name: Quincy SimmondsRobert Pies MRN: [id number] Date of Birth: 05/19/1932  Late Entry:  Medicare Important Message Given:  Yes    Makena Mcgrady, Chapman FitchHenrietta T, RN 03/08/2016, 2:22 PM

## 2016-03-08 NOTE — Care Management Note (Signed)
Case Management Note  Patient Details  Name: Jose SimmondsRobert Lowery MRN: [id number] Date of Birth: 04/07/1932  Subjective/Objective:   CM talked with pt and daughter, determined that pt was active PTA, lives with supportive family.  He will need nebulizer machine and home health services, [facility].               Expected Discharge Plan:  Home w Home Health Services  Discharge planning Services  CM Consult  Post Acute Care Choice:  Durable Medical Equipment, Home Health Choice offered to:  Patient  DME Arranged:  Nebulizer machine DME Agency:  [facility]  HH Arranged:  RN, PT, Nurse's Aide, Social Work [employer] [facility]  [facility]  Status of Service:  Completed, signed off  03/08/2016, 9:15 AM

## 2016-03-08 NOTE — Telephone Encounter (Signed)
OV with TP has been canceled per MR.

## 2016-03-08 NOTE — Assessment & Plan Note (Signed)
Clinically improved Per [facility] MD

## 2016-03-08 NOTE — Telephone Encounter (Signed)
Triage  He was given pulmonary fu with Tammy because a nuse from 4e called. Please note: that this patient is  A flu research subject and was seen by me wearing research hat. Tammy Parreet is not on the delegation log for this research study. Therefore  A) if this is research fullowup - please cancel. Research staff will address it  B) However, if is standard of care followup - then is a new consult to pulmoanry = which is an MD only visit. I do not think he needs standard of care visit right now. So please cancel the OV with TP.   Thanks  Dr. Kalman ShanMurali Deon Duer, M.D., [facility].P Pulmonary and Critical Care Medicine Staff Physician [facility] [place] [facility] Pager: [phone], If no answer or between  15:00h - 7:00h: call [phone]  03/08/2016 2:37 PM

## 2016-03-08 NOTE — Assessment & Plan Note (Signed)
Date of dc 03/08/2016 from hospital  Plan opd fu per prpotocol

## 2016-03-08 NOTE — Discharge Summary (Signed)
Jose Lowery ZOX:[id number] DOB: 1932-03-01 DOA: 03/05/2016  PCP: Patria Mane, MD  Admit date: 03/05/2016  Discharge date: 03/08/2016  Admitted From: Home   Disposition:  Home   Recommendations for Outpatient Follow-up:   Follow up with PCP in 1-2 weeks  PCP Please obtain BMP/CBC, 2 view CXR in 1week,  (see Discharge instructions)   PCP Please follow up on the following pending results: None   Home Health: PT,RN, S Work   Equipment/Devices: Engineer, materials  Consultations: Pulmonary Discharge Condition: Stable   CODE STATUS: Full   Diet Recommendation: Heart Healthy with fluid restriction of 1.5 L a day   Chief Complaint  Patient presents with  . Shortness of Breath     Brief history of present illness from the day of admission and additional interim summary    83 year old African-American male with h/o HTN, HLD, CAD s/p stent , was onticagrelor before, afib on coumadin, copd, not on home o2 or chronic steroids, he presented to [facility] due to sob, wheezing, he was found to be hypoxic , o2sats 79-80% on room air, cxr potential interstitial edema and questionable early lobar pneumonia was admitted with Flu, CHF and COPD exacerbation.                                                                    Hospital Course    1.Acute hypoxic respiratory failure due to influenza A infection was in mild COPD exacerbation and possibly some CHF decompensation as well.  Patient currently on Tamiflu, oxygen, clinically doing better appears nontoxic, PCCM following he is on IVIG test trial and got the test medicine, responded well to Lasix given upon admission. He is feeling a whole lot better, symptom-free at this time. Currently stable on room air. We'll complete Tamiflu course, home nebulizer treatments  and machine provided. We will follow with PCP and pulmonary outpatient.  2. COPD exacerbation. Minimal wheezing on exam, his wheezing mostly appears to be upper airway, complete steroid taper, does not require oxygen at this time will continue to monitor, home nebulizer treatments and machine ordered. Home PT and RN ordered. Requested to follow with PCP and Pulmonary.  3. Acute chronic CHF Diastolic EF 60% on TTE this admission - . No previous Echo on file, Much improved with continued diuresis, will be placed on oral Lasix at home along with potassium supplement, request PCP to monitor weight, BMP and diuretic dose closely.  4. Dementia. Hard of hearing. Supportive care, at risk for delirium.  5. CAD. No acute issues continue combination of aspirin, Ticagrelor, statin and Coreg for secondary prevention.  6. Dyslipidemia. On statin continue.  7. GERD. On PPI.  8. BPH. Flomax.  9. Chronic A. fib. [place] vasc 2 score of at least 3. Continue Coumadin  and beta blocker. Follow with PCP.  Lab Results  Component Value Date   INR 1.58 03/08/2016   INR 1.46 03/07/2016   INR 1.62 03/07/2016     Discharge diagnosis     Principal Problem:   Influenza A Active Problems:   Acute respiratory failure with hypoxia (HCC)   Hypoxia   Essential hypertension   Chronic atrial fibrillation (HCC)   Acute encephalopathy   Research subject    Discharge instructions    Discharge Instructions    Diet - low sodium heart healthy    Complete by:  As directed    Discharge instructions    Complete by:  As directed    Follow with Primary MD Patria Mane, MD in 3 days   Get CBC, CMP, Magnesium, INR, 2 view Chest X ray checked  by Primary MD or SNF MD in 3 days ( we routinely change or add medications that can affect your baseline labs and fluid status, therefore we recommend that you get the mentioned basic workup next visit with your PCP, your PCP may decide not to get them or add new tests  based on their clinical decision)  Activity: As tolerated with Full fall precautions use walker/cane & assistance as needed  Disposition Home   Diet:   Heart Healthy , Check your Weight same time everyday, if you gain over 2 pounds, or you develop in leg swelling, experience more shortness of breath or chest pain, call your Primary MD immediately. Follow Cardiac Low Salt Diet and 1.5 lit/day fluid restriction.  On your next visit with your primary care physician please Get Medicines reviewed and adjusted.  Please request your Prim.MD to go over all Hospital Tests and Procedure/Radiological results at the follow up, please get all Hospital records sent to your Prim MD by signing hospital release before you go home.  If you experience worsening of your admission symptoms, develop shortness of breath, life threatening emergency, suicidal or homicidal thoughts you must seek medical attention immediately by calling 911 or calling your MD immediately  if symptoms less severe.  You Must read complete instructions/literature along with all the possible adverse reactions/side effects for all the Medicines you take and that have been prescribed to you. Take any new Medicines after you have completely understood and accpet all the possible adverse reactions/side effects.   Do not drive, operate heavy machinery, perform activities at heights, swimming or participation in water activities or provide baby sitting services if your were admitted for syncope or siezures until you have seen by Primary MD or a Neurologist and advised to do so again.  Do not drive when taking Pain medications.    Do not take more than prescribed Pain, Sleep and Anxiety Medications  Special Instructions: If you have smoked or chewed Tobacco  in the last 2 yrs please stop smoking, stop any regular Alcohol  and or any Recreational drug use.  Wear Seat belts while driving.   Please note  You were cared for by a hospitalist  during your hospital stay. If you have any questions about your discharge medications or the care you received while you were in the hospital after you are discharged, you can call the unit and asked to speak with the hospitalist on call if the hospitalist that took care of you is not available. Once you are discharged, your primary care physician will handle any further medical issues. Please note that NO REFILLS for any discharge medications will be authorized once you  are discharged, as it is imperative that you return to your primary care physician (or establish a relationship with a primary care physician if you do not have one) for your aftercare needs so that they can reassess your need for medications and monitor your lab values.   Increase activity slowly    Complete by:  As directed       Discharge Medications   Allergies as of 03/08/2016   No Known Allergies     Medication List    STOP taking these medications   hydrochlorothiazide 12.5 MG tablet Commonly known as:  HYDRODIURIL   predniSONE 50 MG tablet Commonly known as:  DELTASONE Replaced by:  predniSONE 5 MG tablet     TAKE these medications   albuterol 108 (90 Base) MCG/ACT inhaler Commonly known as:  PROVENTIL HFA;VENTOLIN HFA Inhale 2 puffs into the lungs every 6 (six) hours as needed for wheezing or shortness of breath. What changed:  Another medication with the same name was added. Make sure you understand how and when to take each.   albuterol (2.5 MG/3ML) 0.083% nebulizer solution Commonly known as:  PROVENTIL Take 3 mLs (2.5 mg total) by nebulization every 6 (six) hours as needed for wheezing or shortness of breath. What changed:  You were already taking a medication with the same name, and this prescription was added. Make sure you understand how and when to take each.   amLODipine 10 MG tablet Commonly known as:  NORVASC Take 10 mg by mouth daily.   aspirin 81 MG chewable tablet Chew 81 mg by mouth  daily.   atorvastatin 80 MG tablet Commonly known as:  LIPITOR Take 80 mg by mouth daily.   carvedilol 12.5 MG tablet Commonly known as:  COREG Take 12.5 mg by mouth 2 (two) times daily with a meal.   furosemide 40 MG tablet Commonly known as:  LASIX Take 1 tablet (40 mg total) by mouth daily.   ipratropium 0.02 % nebulizer solution Commonly known as:  ATROVENT Take 2.5 mLs (0.5 mg total) by nebulization 4 (four) times daily.   lisinopril 40 MG tablet Commonly known as:  PRINIVIL,ZESTRIL Take 40 mg by mouth daily.   magnesium oxide 400 (241.3 Mg) MG tablet Commonly known as:  MAG-OX Take 1 tablet (400 mg total) by mouth daily.   nitroGLYCERIN 0.4 MG SL tablet Commonly known as:  NITROSTAT Place 0.4 mg under the tongue every 5 (five) minutes as needed for chest pain.   oseltamivir 75 MG capsule Commonly known as:  TAMIFLU Take 1 capsule (75 mg total) by mouth 2 (two) times daily.   pantoprazole 20 MG tablet Commonly known as:  PROTONIX Take 20 mg by mouth 2 (two) times daily.   potassium chloride SA 20 MEQ tablet Commonly known as:  K-DUR,KLOR-CON Take 2 tablets (40 mEq total) by mouth daily.   predniSONE 5 MG tablet Commonly known as:  DELTASONE Label  & dispense according to the schedule below. 10 Pills PO for 3 days then, 8 Pills PO for 3 days, 6 Pills PO for 3 days, 4 Pills PO for 3 days, 2 Pills PO for 3 days, 1 Pills PO for 3 days, 1/2 Pill  PO for 3 days then STOP. Total 95 pills. Replaces:  predniSONE 50 MG tablet   tamsulosin 0.4 MG Caps capsule Commonly known as:  FLOMAX Take 0.4 mg by mouth.   ticagrelor 90 MG Tabs tablet Commonly known as:  BRILINTA Take 90 mg by mouth 2 (  two) times daily.   warfarin 2 MG tablet Commonly known as:  COUMADIN Take 2 mg by mouth daily. As directed by coumadin clinic            Durable Medical Equipment        Start     Ordered   03/08/16 [phone]  For home use only DME Nebulizer/meds  Once    Question:   Patient needs a nebulizer to treat with the following condition  Answer:  COPD (chronic obstructive pulmonary disease) (HCC)   03/08/16 0840      Follow-up Information    Patria ManeGASSEMI, MIKE, MD. Schedule an appointment as soon as possible for a visit in 3 day(s).   Specialty:  Internal Medicine Contact information: [address] [facility] [address] [phone]        [facility]AM[facility]AMY,MURALI, MD. Schedule an appointment as soon as possible for a visit in 1 week(s).   Specialty:  Pulmonary Disease Contact information: [address] [place] [address] [phone]           Major procedures and Radiology Reports - PLEASE review detailed and final reports thoroughly  -     TTE    Left ventricle: There was moderate concentric hypertrophy. Systolic function was normal. The estimated ejection fraction was in the range of 60% to 65%. Doppler parameters  are consistent with restrictive physiology, indicative of decreased left ventricular diastolic compliance and/or increased left atrial pressure. - Aortic valve: There was very mild stenosis. There was mild regurgitation. Valve area (VTI): 1.86 cm^2. Valve area (Vmax): 1.57 cm^2. Valve area (Vmean): 1.64 cm^2. - Mitral valve: Calcified annulus. - Left atrium: The atrium was moderately dilated. - Right atrium: The atrium was moderately dilated. - Pulmonary arteries: PA peak pressure: 37 mm Hg (S).   Dg Chest Port 1 View  Result Date: 03/08/2016 CLINICAL DATA:  Shortness of breath, history of MI, COPD, hypertension, influenza A. EXAM: PORTABLE CHEST 1 VIEW COMPARISON:  Portable chest x-ray of February 24th and February 23rd 2018. FINDINGS: The lungs are well-expanded. The interstitial markings have worsened throughout the right lung but greatest at the base. The left lung is clear. The cardiac silhouette is enlarged. The pulmonary vascularity is not engorged. There is no pleural effusion. There is  calcification in the wall of the aortic arch. The bony thorax exhibits no acute abnormality. IMPRESSION: COPD. Worsening of interstitial density in the right mid and lower lung consistent with atelectasis or early pneumonia. Thoracic aortic atherosclerosis. Electronically Signed   By: David  SwazilandJordan M.D.   On: 03/08/2016 07:06   Dg Chest Port 1 View  Result Date: 03/06/2016 CLINICAL DATA:  Shortness of breath. EXAM: PORTABLE CHEST 1 VIEW COMPARISON:  03/05/2016 and 10/19/2015. FINDINGS: Trachea is midline. Heart is at the upper limits of normal in size. Mild prominence of the interstitial markings is likely due to technique with some scarring at the lung bases when compared with 10/19/2015. No airspace consolidation or pleural fluid. IMPRESSION: No acute findings. Electronically Signed   By: Leanna BattlesMelinda  Blietz M.D.   On: 03/06/2016 07:06   Dg Chest Portable 1 View  Result Date: 03/05/2016 CLINICAL DATA:  Cough, congestion and shortness of breath. Wheezing over the last 3 days. EXAM: PORTABLE CHEST 1 VIEW COMPARISON:  10/19/2015 FINDINGS: Mild cardiomegaly. Aortic atherosclerosis. Somewhat poor inspiration. Allowing for that, there appears to be prominence of the interstitial markings. This could go along with mild fluid overload/ early congestive  heart failure. Some question about hazy infiltrates in the lower lobes, but this is not definite. Two-view chest radiography would be useful when able. IMPRESSION: Poor inspiration. Question fluid overload/ early interstitial edema. Also question of hazy lower lobe density that could represent developing pneumonia. Consider two-view radiography when able. Electronically Signed   By: Paulina Fusi M.D.   On: 03/05/2016 12:54    Micro Results    Recent Results (from the past 240 hour(s))  MRSA PCR Screening     Status: None   Collection Time: 03/05/16  7:22 PM  Result Value Ref Range Status   MRSA by PCR NEGATIVE NEGATIVE Final    Comment:        The GeneXpert  MRSA Assay (FDA approved for NASAL specimens only), is one component of a comprehensive MRSA colonization surveillance program. It is not intended to diagnose MRSA infection nor to guide or monitor treatment for MRSA infections.     Today   Subjective    Jose Lowery today has no headache,no chest abdominal pain,no new weakness tingling or numbness, feels much better wants to go home today.    Objective   Blood pressure (!) 142/87, pulse 100, temperature 98.8 F (37.1 C), temperature source Oral, resp. rate 20, height 6\' 3"  (1.905 m), weight 86 kg (189 lb 9.6 oz), SpO2 95 %.   Intake/Output Summary (Last 24 hours) at 03/08/16 0921 Last data filed at 03/08/16 0752  Gross per 24 hour  Intake                0 ml  Output             3750 ml  Net            -3750 ml    Exam Awake Alert,  No new F.N deficits, Normal affect Bright.AT,PERRAL, Blind in L eye Supple Neck,No JVD, No cervical lymphadenopathy appriciated.  Symmetrical Chest wall movement, Good air movement bilaterally, mild wheezing RRR,No Gallops,Rubs or new Murmurs, No Parasternal Heave +ve B.Sounds, Abd Soft, Non tender, No organomegaly appriciated, No rebound -guarding or rigidity. No Cyanosis, Clubbing or edema, No new Rash or bruise   Data Review   CBC w Diff:  Lab Results  Component Value Date   WBC 13.7 (H) 03/08/2016   HGB 10.5 (L) 03/08/2016   HCT 33.5 (L) 03/08/2016   PLT 149 (L) 03/08/2016   LYMPHOPCT 5 03/07/2016   MONOPCT 7 03/07/2016   EOSPCT 0 03/07/2016   BASOPCT 0 03/07/2016    CMP:  Lab Results  Component Value Date   NA 139 03/08/2016   K 3.6 03/08/2016   CL 97 (L) 03/08/2016   CO2 35 (H) 03/08/2016   BUN 22 (H) 03/08/2016   CREATININE 1.04 03/08/2016   PROT 6.7 03/07/2016   ALBUMIN 2.6 (L) 03/07/2016   BILITOT 0.5 03/07/2016   ALKPHOS 51 03/07/2016   AST 35 03/07/2016   ALT 17 03/07/2016  .   Total Time in preparing paper work, data evaluation and todays exam - 35  minutes  Leroy Sea M.D on 03/08/2016 at 9:21 AM  [facility]   Office  [phone]

## 2016-03-08 NOTE — Assessment & Plan Note (Signed)
Resolved o2 needs. Has some wheezing - being addressed by [facility] Rx MD

## 2016-03-08 NOTE — Research (Signed)
Title: A Randomized, Double-Blind, Placebo-Controlled Dose Ranging Study Evaluating the Safety Pharmacokinetics and Clinical Benefit of FLU-IGIV in Hospitalized Patients with Serious Influenza A infection. IA-001 (ClinicalTrials.gov Identifier: [id number], Protocol No: IA-001, [facility] Protocol #[id number]#20172016)  RESEARCH SUBJECT. This research study is sponsored by [employer]    ................................................................................................... PI note  03/08/2016 - date of discharge exam note  S: Doing well. Eating. Denies complaints  Off oxygen  O Vitals:   03/08/16 0838 03/08/16 1109 03/08/16 1149 03/08/16 1156  BP:  128/62  120/80  Pulse:    63  Resp:    18  Temp:  98.2 F (36.8 C)  98.5 F (36.9 C)  TempSrc:  Oral  Oral  SpO2: 95%  95% 95%  Weight:      Height:        General Appearance:  Elderly male. 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  Pressors - no  Abdomen:     Soft, no masses, no organomegaly  Genitalia:    Not done  Rectal:   not done  Extremities:   Extremities- no cyanosis, no clubbing, no edema     Skin:   Intact in exposed areas . Sacral area - intact     Neurologic: Alert and eating and responding appropriately. Moves al 4s. Sitting    Acute encephalopathy Resolved; was mild Related to Acute illnesss due to FluA  [facility]  Influenza A Primary problem On Tamiflu SOC [facility]  Acute respiratory failure with hypoxia (HCC) Resolved o2 needs. Has some wheezing - [facility]  Influenza A Clinically improved [facility]  Research exam Date of dc 03/08/2016 from hospital  Plan opd fu per prpotocol    Dr. Kalman ShanMurali Taniah Reinecke, M.D., [facility]C.P Pulmonary and Critical Care Medicine Staff Physician [facility] Pager: [phone], If no answer or between  15:00h - 7:00h: call [phone]  03/08/2016 1:32 PM

## 2016-03-08 NOTE — Discharge Instructions (Signed)
Follow with Primary MD Patria Mane, MD in 3 days   Get CBC, CMP, Magnesium, INR, 2 view Chest X ray checked  by Primary MD or SNF MD in 3 days ( we routinely change or add medications that can affect your baseline labs and fluid status, therefore we recommend that you get the mentioned basic workup next visit with your PCP, your PCP may decide not to get them or add new tests based on their clinical decision)  Activity: As tolerated with Full fall precautions use walker/cane & assistance as needed  Disposition Home   Diet:   Heart Healthy , Check your Weight same time everyday, if you gain over 2 pounds, or you develop in leg swelling, experience more shortness of breath or chest pain, call your Primary MD immediately. Follow Cardiac Low Salt Diet and 1.5 lit/day fluid restriction.  On your next visit with your primary care physician please Get Medicines reviewed and adjusted.  Please request your Prim.MD to go over all Hospital Tests and Procedure/Radiological results at the follow up, please get all Hospital records sent to your Prim MD by signing hospital release before you go home.  If you experience worsening of your admission symptoms, develop shortness of breath, life threatening emergency, suicidal or homicidal thoughts you must seek medical attention immediately by calling 911 or calling your MD immediately  if symptoms less severe.  You Must read complete instructions/literature along with all the possible adverse reactions/side effects for all the Medicines you take and that have been prescribed to you. Take any new Medicines after you have completely understood and accpet all the possible adverse reactions/side effects.   Do not drive, operate heavy machinery, perform activities at heights, swimming or participation in water activities or provide baby sitting services if your were admitted for syncope or siezures until you have seen by Primary MD or a Neurologist and advised to do so  again.  Do not drive when taking Pain medications.    Do not take more than prescribed Pain, Sleep and Anxiety Medications  Special Instructions: If you have smoked or chewed Tobacco  in the last 2 yrs please stop smoking, stop any regular Alcohol  and or any Recreational drug use.  Wear Seat belts while driving.   Please note  You were cared for by a hospitalist during your hospital stay. If you have any questions about your discharge medications or the care you received while you were in the hospital after you are discharged, you can call the unit and asked to speak with the hospitalist on call if the hospitalist that took care of you is not available. Once you are discharged, your primary care physician will handle any further medical issues. Please note that NO REFILLS for any discharge medications will be authorized once you are discharged, as it is imperative that you return to your primary care physician (or establish a relationship with a primary care physician if you do not have one) for your aftercare needs so that they can reassess your need for medications and monitor your lab values.           Information on my medicine - Coumadin   (Warfarin)  This medication education was reviewed with me or my healthcare representative as part of my discharge preparation.    Why was Coumadin prescribed for you? Coumadin was prescribed for you because you have a blood clot or a medical condition that can cause an increased risk of forming blood clots. Blood  clots can cause serious health problems by blocking the flow of blood to the heart, lung, or brain. Coumadin can prevent harmful blood clots from forming. As a reminder your indication for Coumadin is:   Stroke Prevention Because Of Atrial Fibrillation  What test will check on my response to Coumadin? While on Coumadin (warfarin) you will need to have an INR test regularly to ensure that your dose is keeping you in the desired  range. The INR (international normalized ratio) number is calculated from the result of the laboratory test called prothrombin time (PT).  If an INR APPOINTMENT HAS NOT ALREADY BEEN MADE FOR YOU please schedule an appointment to have this lab work done by your health care provider within 7 days. Your INR goal is usually a number between:  2 to 3 or your provider may give you a more narrow range like 2-2.5.  Ask your health care provider during an office visit what your goal INR is.  What  do you need to  know  About  COUMADIN? Take Coumadin (warfarin) exactly as prescribed by your healthcare provider about the same time each day.  DO NOT stop taking without talking to the doctor who prescribed the medication.  Stopping without other blood clot prevention medication to take the place of Coumadin may increase your risk of developing a new clot or stroke.  Get refills before you run out.  What do you do if you miss a dose? If you miss a dose, take it as soon as you remember on the same day then continue your regularly scheduled regimen the next day.  Do not take two doses of Coumadin at the same time.  Important Safety Information A possible side effect of Coumadin (Warfarin) is an increased risk of bleeding. You should call your healthcare provider right away if you experience any of the following: ? Bleeding from an injury or your nose that does not stop. ? Unusual colored urine (red or dark brown) or unusual colored stools (red or black). ? Unusual bruising for unknown reasons. ? A serious fall or if you hit your head (even if there is no bleeding).  Some foods or medicines interact with Coumadin (warfarin) and might alter your response to warfarin. To help avoid this: ? Eat a balanced diet, maintaining a consistent amount of Vitamin K. ? Notify your provider about major diet changes you plan to make. ? Avoid alcohol or limit your intake to 1 drink for women and 2 drinks for men per day. (1  drink is 5 oz. wine, 12 oz. beer, or 1.5 oz. liquor.)  Make sure that ANY health care provider who prescribes medication for you knows that you are taking Coumadin (warfarin).  Also make sure the healthcare provider who is monitoring your Coumadin knows when you have started a new medication including herbals and non-prescription products.  Coumadin (Warfarin)  Major Drug Interactions  Increased Warfarin Effect Decreased Warfarin Effect  Alcohol (large quantities) Antibiotics (esp. Septra/Bactrim, Flagyl, Cipro) Amiodarone (Cordarone) Aspirin (ASA) Cimetidine (Tagamet) Megestrol (Megace) NSAIDs (ibuprofen, naproxen, etc.) Piroxicam (Feldene) Propafenone (Rythmol SR) Propranolol (Inderal) Isoniazid (INH) Posaconazole (Noxafil) Barbiturates (Phenobarbital) Carbamazepine (Tegretol) Chlordiazepoxide (Librium) Cholestyramine (Questran) Griseofulvin Oral Contraceptives Rifampin Sucralfate (Carafate) Vitamin K   Coumadin (Warfarin) Major Herbal Interactions  Increased Warfarin Effect Decreased Warfarin Effect  Garlic Ginseng Ginkgo biloba Coenzyme Q10 Green tea St. Johns wort    Coumadin (Warfarin) FOOD Interactions  Eat a consistent number of servings per week of foods HIGH in Vitamin  K (1 serving =  cup)  Collards (cooked, or boiled & drained) Kale (cooked, or boiled & drained) Mustard greens (cooked, or boiled & drained) Parsley *serving size only =  cup Spinach (cooked, or boiled & drained) Swiss chard (cooked, or boiled & drained) Turnip greens (cooked, or boiled & drained)  Eat a consistent number of servings per week of foods MEDIUM-HIGH in Vitamin K (1 serving = 1 cup)  Asparagus (cooked, or boiled & drained) Broccoli (cooked, boiled & drained, or raw & chopped) Brussel sprouts (cooked, or boiled & drained) *serving size only =  cup Lettuce, raw (green leaf, endive, romaine) Spinach, raw Turnip greens, raw & chopped   These websites have more information  on Coumadin (warfarin):  http://www.king-russell.com/; https://www.hines.net/;

## 2016-03-08 NOTE — Telephone Encounter (Signed)
This hospital follow up is fine as the pt did see MR while in the hospital. Nothing further was needed.

## 2016-03-08 NOTE — Progress Notes (Signed)
Patient being discharged home. Patient/family educated on new/changed medications. Patient/family informed of appts. Patient/family verbalized understanding.. Patient discharged home with family.

## 2016-03-08 NOTE — Progress Notes (Signed)
ANTICOAGULATION CONSULT NOTE - Follow Up Consult  Pharmacy Consult for Warfarin Indication: atrial fibrillation  No Known Allergies  Patient Measurements: Height: 6\' 3"  (190.5 cm) Weight: 189 lb 9.6 oz (86 kg) IBW/kg (Calculated) : 84.5  Vital Signs: Temp: 98.2 F (36.8 C) (02/26 1109) Temp Source: Oral (02/26 1109) BP: 128/62 (02/26 1109) Pulse Rate: 100 (02/26 0812)  Labs:  Recent Labs  03/05/16 2002 03/06/16 0207 03/06/16 0556 03/06/16 1430 03/07/16 0329 03/07/16 1526 03/08/16 0358 03/08/16 0955  HGB  --  9.8* 9.1*  --   --  10.3* 10.5*  --   HCT  --  32.1* 29.3*  --   --  33.5* 33.5*  --   PLT  --  187 128*  --   --  155 149*  --   APTT  --   --   --  39*  --  34  --  31  LABPROT  --  18.5*  --   --  19.4* 17.9* 19.0*  --   INR  --  1.53  --   --  1.62 1.46 1.58  --   CREATININE  --  1.38* 1.20  --   --  1.08 1.04 1.09  TROPONINI <0.03 <0.03 <0.03  --   --   --   --   --     Estimated Creatinine Clearance: 63.5 mL/min (by C-G formula based on SCr of 1.09 mg/dL).   Medical History: Past Medical History:  Diagnosis Date  . COPD (chronic obstructive pulmonary disease) (HCC)   . Hyperlipidemia   . Hypertension   . Myocardial infarction   . Prosthetic eye globe    left    Assessment: [age] YOM with Hx Afib and on warfarin PTA, admited with flu+.  Pharmacy consulted to resume warfarin from PTA.  INR today remains SUBtherapeutic (INR 1.58 << 1.46, goal of 2-3). Hgb/Hct stable, plts 149. No bleeding noted.  PTA dose: 2mg  daily  Goal of Therapy:  INR 2-3 Monitor platelets by anticoagulation protocol: Yes   Plan:  1. Warfarin 3 mg x 1 dose at 1800 today (if still here) 2. Will continue to monitor for any signs/symptoms of bleeding and will follow up with PT/INR in the a.m. (if still here)  Thank you for allowing pharmacy to be a part of this patient's care.  Georgina PillionElizabeth Hailee Hollick, PharmD, BCPS Clinical Pharmacist Pager: [phone] Clinical phone for  03/08/2016 from 7a-3:30p: [phone] If after 3:30p, please call main pharmacy at: [phone] 03/08/2016 11:34 AM

## 2016-03-08 NOTE — Telephone Encounter (Signed)
MR please ensure only investigator's are seeing this patient for research follow up visits.

## 2016-03-12 ENCOUNTER — Other Ambulatory Visit (INDEPENDENT_AMBULATORY_CARE_PROVIDER_SITE_OTHER): Payer: Medicare Other

## 2016-03-12 ENCOUNTER — Ambulatory Visit (INDEPENDENT_AMBULATORY_CARE_PROVIDER_SITE_OTHER): Payer: Medicare Other | Admitting: Internal Medicine

## 2016-03-12 VITALS — BP 100/70 | HR 122 | Temp 97.9°F | Resp 20

## 2016-03-12 DIAGNOSIS — Z006 Encounter for examination for normal comparison and control in clinical research program: Secondary | ICD-10-CM

## 2016-03-12 DIAGNOSIS — J09X2 Influenza due to identified novel influenza A virus with other respiratory manifestations: Secondary | ICD-10-CM

## 2016-03-12 DIAGNOSIS — Z970 Presence of artificial eye: Secondary | ICD-10-CM | POA: Insufficient documentation

## 2016-03-12 LAB — GLUCOSE, RANDOM: Glucose, Bld: 166 mg/dL — ABNORMAL HIGH (ref 70–99)

## 2016-03-12 NOTE — Progress Notes (Signed)
Title: A Randomized, Double-Blind, Placebo-Controlled Dose Ranging Study Evaluating the Safety Pharmacokinetics and Clinical Benefit of FLU-IGIV in Hospitalized Patients with Serious Influenza A infection. IA-001 (ClinicalTrials.gov Identifier: [id number], Protocol No: IA-001, [facility] Protocol #[id number]#20172016)  RESEARCH SUBJECT. This research study is sponsored by [employer]   The investigational product is called NP-025 aka FLU-IGIV or anti-influenza immune globulin intravenous. It is produced from source plasma collected from [place] Education officer, environmentalood and Drug Administration (FDA) licensed plasma collection establishments from healthy donors who have recovered from influenza (convalescent) and/or were vaccinated against seasonal influenza strains. The plasma contains a relatively high concentration of polyclonal antibodies directed against seasonal influenza strains, specifically influenza A strains H1N1 ([place]) and H3N2 ([place]). It is a glycoprotein of 150-160 kilodaltons against Hemagluttinin (HA) and Neuraminidase (NA) surface proteins.    ...................................................................................................  Clinical Research Coordinator / Research RN note : This visit for Subject Jose Lowery with 06/29/1932 on 03/12/2016 for the above protocol is Visit/Encounter Day 8 and is for purpose of Day 8 follow up visit. The consent for this encounter is under Protocol Version 3.0 and is currently IRB approved. Subject thanked for participation in research and contribution to science.    In this visit 03/12/2016 the subject will be evaluated by investigator named Kalman ShanMurali Ramaswamy, MD. This research coordinator has verified that the investigator is uptodate with his/her training logs.  Jose Lowery is here for his Day 8 follow up visit and is accompanied by his son-in-law, Jose Lowery. Because the mental status and emotional capacity of this subject  was not adequate to reconsent at this time, subject was not reconsented during this visit. Physical exam completed per protocol by investigator Kalman ShanMurali Ramaswamy, MD. See MD note. Vital signs assessed, patient is tachycardic with pulse rate of 122 bpm. This will be marked as clinically significant per Dr. Kalman ShanMurali Ramaswamy. See AE log. Subject is asymptomatic at this time. Subject  encouraged to seek medical attention as needed should he feel dizzy, short of breath, or light headed.   Subject's son-in-law states Jose Lowery is able to move around more since hospital discharge but is easily fatigued and not able to resume normal activities at baseline due to his low energy level.  Patient reports no other new changes in health. Subject's son-in-law states that subject has been taking his medications as prescribed with no missed/held doses and that subject has completed his course of oseltamivir.   Day 15 visit to be scheduled with son-in-law Jose Lowery, this RN to contact Jose Lowery on Monday morning to confirm next appointment date/time.   Toula MoosFrances J Jaceyon Strole, RN, BSN Clinical Research Nurse  [facility] [place] Office: [phone]   2:53 PM 03/12/2016

## 2016-03-12 NOTE — Progress Notes (Signed)
Subjective:     Patient ID: Jose Lowery, male   DOB: 09/27/1932, 83 y.o.   MRN: [id number]  HPI   OV 03/12/2016  CC; research . Flu study  Title: A Randomized, Double-Blind, Placebo-Controlled Dose Ranging Study Evaluating the Safety Pharmacokinetics and Clinical Benefit of FLU-IGIV in Hospitalized Patients with Serious Influenza A infection. IA-001 (ClinicalTrials.gov Identifier: [id number], Protocol No: IA-001, [facility] Protocol #[id number]#20172016)  RESEARCH SUBJECT. This research study is sponsored by [employer]  ...................................................................................................   S: d8 visit. Exam odcumentation - exam done at 14.10 03/12/2016   Review of Systems     Objective:   Physical Exam   General Appearance:  Looks better  Head:    Normocephalic, without obvious abnormality, atraumatic  Eyes:    PERRL - yes on right, left blind, conjunctiva/corneas - clear on right   Ears:    Normal external ear canals, both ears  Nose:   NG tube - no  Throat:  ETT TUBE - no , OG tube - no  Neck:   Supple,  No enlargement/tenderness/nodules     Lungs:     Still wheezing bu better  Chest wall:    No deformity  Heart:    S1 and S2 normal, no murmur,  Abdomen:     Soft, no masses, no organomegaly  Genitalia:    Not done  Rectal:   not done  Extremities:   Extremities- no cyanosis, no clubbing no edema     Skin:   Intact in exposed areas      Neurologic:   . Moves all 4s - yes. Alert and oriented but does not have capacity        Assessment:     Research Flu study Wheezing ongoing - smoker - improved    Plan:     Follow per protocl Exam with wheezing though imroved - so refer pulmonary consultation for standard of care    Dr. Kalman ShanMurali Debra Colon, M.D., [facility] Pulmonary and Critical Care Medicine Staff Physician [facility] Pulmonary and Critical Care Pager: [phone], If no answer or between  15:00h - 7:00h:  call [phone]  03/12/2016 2:14 PM

## 2016-03-15 ENCOUNTER — Telehealth: Payer: Self-pay | Admitting: *Deleted

## 2016-03-15 NOTE — Telephone Encounter (Signed)
Title: A Randomized, Double-Blind, Placebo-Controlled Dose Ranging Study Evaluating the Safety Pharmacokinetics and Clinical Benefit of FLU-IGIV in Hospitalized Patients with Serious Influenza A infection. IA-001 (ClinicalTrials.gov Identifier: [id number], Protocol No: IA-001, [facility] Protocol #[id number]#20172016)  RESEARCH SUBJECT. This research study is sponsored by [employer]  ...................................................................................................  This patient's son in law, Fayrene FearingJames, called to schedule follow up visit Day 15 for the above listed protocol.  8/Mar/18 at 10:00am scheduled.  Immediately after scheduling the visit, Fayrene FearingJames called back and asked about a new medication that had been prescribed for the patient.  Fayrene FearingJames believed the medication was Spiriva, but was uncertain.  Fayrene FearingJames asked if Mr. Quincy SimmondsRobert Willadsen should begin taking the medication.  Fayrene FearingJames confirmed that this was prescribed by Mr. Yetta BarreJones' family doctor.  This RN advised Fayrene FearingJames to contact the physician's office who prescribed the medication to determine how and when the physician intended Mr. Yetta BarreJones to take the medication.  This RN confirmed that there was nothing related to the research study that would prevent Mr. Yetta BarreJones from taking the new medication.  Fayrene FearingJames verbalized understanding.  Kinnie FeilStacey Mackie Goon, RN, BSN, [facility] Clinical Research Nurse [facility] Office [phone]

## 2016-03-16 NOTE — Telephone Encounter (Signed)
PI OVERSIGHT ATTESTATION  I the Principal Investigator (PI) for the above mentioned study attest that I reviewed the above mentioned clinical research coordinator nurse) notes on research subject  Jose SimmondsRobert Hashem  born 06/02/1932 . I  agree with the findings mentioned above   Dr. Kalman ShanMurali Trequan Marsolek, M.D., F.C.C.P., ACRP-CPI Pulmonary and Critical Care Medicine Principal Investigator & Staff Physician [facility]  [place] Pulmonary and Critical Care Pager: [phone], If no answer or between  15:00h - 7:00h: call [phone]  03/16/2016 1:00 PM

## 2016-03-18 ENCOUNTER — Encounter: Payer: Self-pay | Admitting: Internal Medicine

## 2016-03-18 ENCOUNTER — Ambulatory Visit (INDEPENDENT_AMBULATORY_CARE_PROVIDER_SITE_OTHER): Payer: Medicare Other | Admitting: Internal Medicine

## 2016-03-18 ENCOUNTER — Telehealth: Payer: Self-pay | Admitting: Internal Medicine

## 2016-03-18 ENCOUNTER — Other Ambulatory Visit (INDEPENDENT_AMBULATORY_CARE_PROVIDER_SITE_OTHER): Payer: Medicare Other

## 2016-03-18 VITALS — BP 92/78 | HR 80 | Temp 98.2°F | Resp 14

## 2016-03-18 DIAGNOSIS — R062 Wheezing: Secondary | ICD-10-CM | POA: Insufficient documentation

## 2016-03-18 DIAGNOSIS — Z006 Encounter for examination for normal comparison and control in clinical research program: Secondary | ICD-10-CM

## 2016-03-18 DIAGNOSIS — E875 Hyperkalemia: Secondary | ICD-10-CM

## 2016-03-18 DIAGNOSIS — J101 Influenza due to other identified influenza virus with other respiratory manifestations: Secondary | ICD-10-CM | POA: Diagnosis not present

## 2016-03-18 LAB — COMPREHENSIVE METABOLIC PANEL
ALBUMIN: 3.3 g/dL — AB (ref 3.5–5.2)
ALK PHOS: 54 U/L (ref 39–117)
ALT: 24 U/L (ref 0–53)
AST: 17 U/L (ref 0–37)
BUN: 24 mg/dL — AB (ref 6–23)
CO2: 34 mEq/L — ABNORMAL HIGH (ref 19–32)
CREATININE: 1.31 mg/dL (ref 0.40–1.50)
Calcium: 9 mg/dL (ref 8.4–10.5)
Chloride: 105 mEq/L (ref 96–112)
GFR: 67.48 mL/min (ref >60.00)
Glucose, Bld: 79 mg/dL (ref 70–99)
POTASSIUM: 5.7 meq/L — AB (ref 3.5–5.1)
SODIUM: 139 meq/L (ref 135–145)
TOTAL PROTEIN: 6.7 g/dL (ref 6.0–8.3)
Total Bilirubin: 0.5 mg/dL (ref 0.2–1.2)

## 2016-03-18 LAB — CBC WITH DIFFERENTIAL/PLATELET
BASOS ABS: 0 10*3/uL (ref 0.0–0.1)
Basophils Relative: 0.2 % (ref 0.0–3.0)
EOS ABS: 0 10*3/uL (ref 0.0–0.7)
EOS PCT: 0.5 % (ref 0.0–5.0)
HCT: 35.5 % — ABNORMAL LOW (ref 39.0–52.0)
HEMOGLOBIN: 11.1 g/dL — AB (ref 13.0–17.0)
LYMPHS ABS: 1 10*3/uL (ref 0.7–4.0)
Lymphocytes Relative: 14.7 % (ref 12.0–46.0)
MCHC: 31.2 g/dL (ref 30.0–36.0)
MCV: 80.3 fl (ref 78.0–100.0)
MONO ABS: 0.5 10*3/uL (ref 0.1–1.0)
Monocytes Relative: 7.3 % (ref 3.0–12.0)
NEUTROS PCT: 77.3 % — AB (ref 43.0–77.0)
Neutro Abs: 5.3 10*3/uL (ref 1.4–7.7)
Platelets: 238 10*3/uL (ref 150.0–400.0)
RBC: 4.43 Mil/uL (ref 4.22–5.81)
RDW: 20 % — ABNORMAL HIGH (ref 11.5–15.5)
WBC: 6.9 10*3/uL (ref 4.0–10.5)

## 2016-03-18 LAB — PROTIME-INR
INR: 2.5 ratio — AB (ref 0.8–1.0)
PROTHROMBIN TIME: 27.2 s — AB (ref 9.6–13.1)

## 2016-03-18 LAB — APTT: APTT: 29.6 s (ref 23.4–32.7)

## 2016-03-18 NOTE — Patient Instructions (Signed)
Research subject d15 visit for SGS flu study. Does not have good memory and definitely no capacity to understand research. Family says this is his recent even pre-flu baseline. LAR consent continued  Plan Blood draw and study procedures per protocol  Wheezing Still with ongoing wheezing despite spiriva conmed change between d7 and d15 research visits  Plan [facility] pulmonary [facility]zing

## 2016-03-18 NOTE — Telephone Encounter (Signed)
   Triage  Saw Jose Lowery for research 03/18/2016 -> he is wheezing and on labs his K is high (he is on lasix and kcl and lisnopril). He has standard of care visit with Jose Lowery 04/07/16  My assessment  - I have told research nurse Jose Lowery - he has an AE, mild, unrelated to study drug. Related to KCL and lisinopril   PLAN for triage as part of medical mgmt (not research)   - dc lisinopril. Start losartan 25mg  daily  - dc kcl - not to take this while on above - ok to cntinue lasix for now - check bmet early next week - standard of care -> on 03/22/16 or 03/23/16 - I wll call them with results  Thakns   Dr. Kalman ShanMurali Rema Lievanos, M.D., [facility]C.P Pulmonary and Critical Care Medicine Staff Physician [name] Pulmonary and Critical Care Pager: [phone], If no answer or between  15:00h - 7:00h: call [phone]  03/18/2016 4:10 PM       PULMONARY No results for input(s): PHART, PCO2ART, PO2ART, HCO3, TCO2, O2SAT in the last 168 hours.  Invalid input(s): PCO2, PO2  CBC  Recent Labs Lab 03/18/16 1027  HGB 11.1*  HCT 35.5*  WBC 6.9  PLT 238.0    COAGULATION  Recent Labs Lab 03/18/16 1027  INR 2.5*    CARDIAC  No results for input(s): TROPONINI in the last 168 hours. No results for input(s): PROBNP in the last 168 hours.   CHEMISTRY  Recent Labs Lab 03/12/16 1432 03/18/16 1027  NA  --  139  K  --  5.7*  CL  --  105  CO2  --  34*  GLUCOSE 166* 79  BUN  --  24*  CREATININE  --  1.31  CALCIUM  --  9.0   Estimated Creatinine Clearance: 52.9 mL/min (by C-G formula based on SCr of 1.31 mg/dL).   LIVER  Recent Labs Lab 03/18/16 1027  AST 17  ALT 24  ALKPHOS 54  BILITOT 0.5  PROT 6.7  ALBUMIN 3.3*  INR 2.5*     INFECTIOUS No results for input(s): LATICACIDVEN, PROCALCITON in the last 168 hours.   ENDOCRINE CBG (last 3)  No results for input(s): GLUCAP in the last 72 hours.       IMAGING x48h  -  image(s) personally visualized  -   highlighted in bold No results found.       .Marland Kitchen

## 2016-03-18 NOTE — Assessment & Plan Note (Signed)
d15 visit for SGS flu study. Does not have good memory and definitely no capacity to understand research. Family says this is his recent even pre-flu baseline. LAR consent continued  Plan Blood draw and study procedures per protocol

## 2016-03-18 NOTE — Progress Notes (Signed)
Subjective:     Patient ID: Jose Lowery, male   DOB: [birth year], 83 y.o.   MRN: [id number]  HPI   OV 03/18/2016  Chief Complaint  Patient presents with  . Research    Day 15 Visit for IA-001   Title: A Randomized, Double-Blind, Placebo-Controlled Dose Ranging Study Evaluating the Safety Pharmacokinetics and Clinical Benefit of FLU-IGIV in Hospitalized Patients with Serious Influenza A infection. IA-001 (ClinicalTrials.gov Identifier: [id number], Protocol No: IA-001, [facility] Protocol #[id number])  RESEARCH SUBJECT. This research study is sponsored by [employer]   The investigational product is called NP-025 aka FLU-IGIV or anti-influenza immune globulin intravenous. It is produced from source plasma collected from [place] Transport planner (FDA) licensed plasma collection establishments from healthy donors who have recovered from influenza (convalescent) and/or were vaccinated against seasonal influenza strains. The plasma contains a relatively high concentration of polyclonal antibodies directed against seasonal influenza strains, specifically influenza A strains H1N1 ([place], [place]) and H3N2 ([place]). It is a glycoprotein of 150-160 kilodaltons against Hemagluttinin (HA) and Neuraminidase (NA) surface proteins.   Key Inclusion Criteria: Adult patients; locally determined positive influenza A infection (Rapid Antigen Test or PCR) from a specimen obtained within 2 days prior to randomization; onset of symptoms </= 6 days prior to randomization; experiencing >/= 1 respiratory symptom (cough, sore throat, nasal congestion) and >/= 1 constitutional symptom (headache, myalgia, feverishness or fatigue); NEW score >/=3 at screening; must have an active order for a minimum 5 day course of oseltamivir (39m/twice daily).  Key Exclusion Criteria: History of hypersensitivity to blood or plasma products; history of allergy to latex or rubber; pregnancy or  lactation; known IgA deficiency; medical conditions for which receipt of a 500 mL volume of IV fluid may be dangerous; a pre-existing condition or use of medication that may place the individual at a substantially increased risk of thrombosis; anticipated life expectancy < 90 days; confirmed bacterial pneumonia or any concurrent respiratory viral infection that is not influenza A.  Key Randomization Information: Patients will be randomized to receive a single IV administration of FLU-IGIV (NP-025) or placebo (normal saline 0.9% NaCL). Two dose levels are being assessed, each a fixed dose by volume - 10 vials (450 mL) for the high dose and 5 vials (225 mL) for the low dose. Assuming a patient weight range of 60 kg to 100 kg, patients will receive approximately 0.32 - 0.53 g/kg of IgG protein for the high dose and approximately 0.16 - 0.26 g/kg for the low dose, in a single infusion.  Key other data: Side effects with infusion  Incidence Occurrence Side effect  Common 1-15%, typically < 5% First 30 minutes of infusion Back pain, abdominal pain, nausea, vomiting  Common 1-15%, typically < 5% First few minutes to several hours after infusion Chills, fever, headache, myalgia, fatigue  Rare Seconds to several hours after infusion Hypersensitivity reaction (happens with IgA deficiency), flushing, facial swelling, dyspnea, cyanosis, shock, cardiac arrest, pneumonitis, renal failure, aseptic meningitis, hemolysis, viral infection  Rare Seconds to several hours after infusion Thrombosis (at risk patients are those with a history of DVT, arterial thrombosis, multiple cardiovascular risk factors, impaired cardiac output, advanced age, coagulation disorders, prolonged immobilization, use of estrogen, indwelling catheters, known/suspected hyperviscosity)  Recommendation: For patients who are at risk of developing thrombotic events, administer NP-025 at the minimum rate of infusion practicable, not to exceed 2  mL/min. Ensure adequate hydration in patients before administration. Monitor  Rare 1-6 hours after infusion Transfusion related  Acute Lung Injury (TRALI)  Rare  Hemolysis (dose related, happens in total > 2g/kg and non-O blood group), severe hemolysis may lead to renal dysfunction/failure.  Recommendation: Check hemoglobin pre- and post 36-96h and 7-10 days transfusion  Rare Several hours to two  days after infusion Aseptic Meningitis (AMS) (dose related, happens in total > 2g/kg)   NOTE: NP-025 contains 10% maltose - will interfere with glucose measurements if non-glucose specific measurement devices are not used. Results in false high glucose levels can  result in increased insulin -> life threatening hypoglycemia  ...................................................................................................   S: Jose Lowery  [birth year] present for d15 followup of above research study. According to the son-in-law who is here with him patient's appetite is improved to baseline. This is some fatigue. Patient's activity levels are improving but still not atpre  influenza baseline. The thinking is rated as mild but it is ongoing. There are no new issues although there is still ongoing wheezing. Between last research visit in this research visit he was started on Spiriva by primary care physician but the wheezing still has not improved. It is mild and it is baseline. It is free influenza a level in the family is not concerned about it. According to the son-in-law patient's memory is impaired and he does not have capacity to understand that he isn't a research protocol. The for the consenting his family. The son-in-law has expressed continued interest in inpatient participating in research protocol and follow-up       Review of Systems     Objective:   Physical Exam  Vitals:   03/18/16 1046  BP: 92/78  Pulse: 80  Resp: 14  Temp: 98.2 F (36.8 C)  SpO2: 92%    Estimated body mass  index is 23.7 kg/m as calculated from the following:   Height as of 03/05/16: _0  (1.905 m).   Weight as of 03/06/16: 189 lb 9.6 oz (86 kg).   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  Pressors - no  Abdomen:     Soft, no masses, no organomegaly  Genitalia:    Not done  Rectal:   not done  Extremities:   Extremities- No cyanosis no clubbing no edema      Skin:   Intact in exposed areas .      Neurologic:  Patient's memory and insight is definitely impaired. He does not have capacity. But he is not confused. He understands and I am a doctor . but he does not know research. He does not understand specialty of medication medicine such as pulmonary medicine. He does not understand the difference between research doctor and standard care doctor He understands what a medication is but unable to differentiate between different types of medication especially research visit for medication. According to the family this is baseline  Assessment:     Research subject - Plan: INR/PT, APTT, CBC w/Diff, Comp Met (CMET)  Influenza A - Plan: INR/PT, APTT, CBC w/Diff, Comp Met (CMET)  Wheezing      Plan:     Research subject d15 visit for SGS flu study. Does not have good memory and definitely no capacity to understand research. Family says this is his recent even pre-flu baseline. LAR consent continued  Plan Blood draw and study procedures per protocol  Wheezing Still with ongoing wheezing despite  spiriva conmed change between d7 and d15 research visits  Plan Refer pulmonary for [facility] care of wheezing    Dr. Brand Males, M.D., [facility].C.P Pulmonary and Critical Care Medicine Staff Physician [facility] Pager: [phone], If no answer or between  15:00h - 7:00h: call [phone]  03/18/2016 11:25 AM

## 2016-03-18 NOTE — Telephone Encounter (Signed)
lmtcb X1 for pt.  [facility].

## 2016-03-18 NOTE — Progress Notes (Signed)
Title: A Randomized, Double-Blind, Placebo-Controlled Dose Ranging Study Evaluating the Safety Pharmacokinetics and Clinical Benefit of FLU-IGIV in Hospitalized Patients with Serious Influenza A infection. IA-001 (ClinicalTrials.gov Identifier: [id number], Protocol No: IA-001, [facility] Protocol #[id number])  RESEARCH SUBJECT. This research study is sponsored by [employer]   The investigational product is called NP-025 aka FLU-IGIV or anti-influenza immune globulin intravenous. It is produced from source plasma collected from [place] Transport planner (FDA) licensed plasma collection establishments from healthy donors who have recovered from influenza (convalescent) and/or were vaccinated against seasonal influenza strains. The plasma contains a relatively high concentration of polyclonal antibodies directed against seasonal influenza strains, specifically influenza A strains H1N1 ([place], [place]) and H3N2 ([place]). It is a glycoprotein of 150-160 kilodaltons against Hemagluttinin (HA) and Neuraminidase (NA) surface proteins.   Key Inclusion Criteria: Adult patients; locally determined positive influenza A infection (Rapid Antigen Test or PCR) from a specimen obtained within 2 days prior to randomization; onset of symptoms </= 6 days prior to randomization; experiencing >/= 1 respiratory symptom (cough, sore throat, nasal congestion) and >/= 1 constitutional symptom (headache, myalgia, feverishness or fatigue); NEW score >/=3 at screening; must have an active order for a minimum 5 day course of oseltamivir ('75mg'$ /twice daily).  Key Exclusion Criteria: History of hypersensitivity to blood or plasma products; history of allergy to latex or rubber; pregnancy or lactation; known IgA deficiency; medical conditions for which receipt of a 500 mL volume of IV fluid may be dangerous; a pre-existing condition or use of medication that may place the individual at a  substantially increased risk of thrombosis; anticipated life expectancy < 90 days; confirmed bacterial pneumonia or any concurrent respiratory viral infection that is not influenza A.  Key Randomization Information: Patients will be randomized to receive a single IV administration of FLU-IGIV (NP-025) or placebo (normal saline 0.9% NaCL). Two dose levels are being assessed, each a fixed dose by volume - 10 vials (450 mL) for the high dose and 5 vials (225 mL) for the low dose. Assuming a patient weight range of 60 kg to 100 kg, patients will receive approximately 0.32 - 0.53 g/kg of IgG protein for the high dose and approximately 0.16 - 0.26 g/kg for the low dose, in a single infusion.  Key other data: Side effects with infusion  Incidence Occurrence Side effect  Common 1-15%, typically < 5% First 30 minutes of infusion Back pain, abdominal pain, nausea, vomiting  Common 1-15%, typically < 5% First few minutes to several hours after infusion Chills, fever, headache, myalgia, fatigue  Rare Seconds to several hours after infusion Hypersensitivity reaction (happens with IgA deficiency), flushing, facial swelling, dyspnea, cyanosis, shock, cardiac arrest, pneumonitis, renal failure, aseptic meningitis, hemolysis, viral infection  Rare Seconds to several hours after infusion Thrombosis (at risk patients are those with a history of DVT, arterial thrombosis, multiple cardiovascular risk factors, impaired cardiac output, advanced age, coagulation disorders, prolonged immobilization, use of estrogen, indwelling catheters, known/suspected hyperviscosity)  Recommendation: For patients who are at risk of developing thrombotic events, administer NP-025 at the minimum rate of infusion practicable, not to exceed 2 mL/min. Ensure adequate hydration in patients before administration. Monitor  Rare 1-6 hours after infusion Transfusion related Acute Lung Injury (TRALI)  Rare  Hemolysis (dose related, happens in total >  2g/kg and non-O blood group), severe hemolysis may lead to renal dysfunction/failure.  Recommendation: Check hemoglobin pre- and post 36-96h and 7-10 days transfusion  Rare Several hours to two  days  after infusion Aseptic Meningitis (AMS) (dose related, happens in total > 2g/kg)   NOTE: NP-025 contains 10% maltose - will interfere with glucose measurements if non-glucose specific measurement devices are not used. Results in false high glucose levels can  result in increased insulin -> life threatening hypoglycemia  ................................................................................................... Clinical Research Coordinator / Research RN note : This visit for [name] with DOB of 05/21/1932 on 03/18/2016 for the above protocol is Visit/Encounter Day 15 and is for purpose of research. The consent for this encounter is under Protocol Version 3.0 and is currently IRB approved. LAR expressed continued interest and consent in continuing to have the subject continue in the research study. Subject thanked for participation.  All required procedures were completed per protocol requirements for Day 15. Please refer to progress note from Dr. Marchelle Gearingamaswamy regarding the physical exam performed and his discussion with the subject and LAR (subjects son-in-law) to determine the subjects ability to sign the re-consent form for the study.   In this visit 03/18/2016 the subject will be evaluated by investigator named Dr. Kalman ShanMurali Ramaswamy . This research coordinator has verified that the investigator is uptodate with his training logs.  Signed by Carron CurieJennifer Daena Alper, CMA Clinical Research Coordinator [facility]  [place], [place] 2:23 PM 03/18/2016

## 2016-03-18 NOTE — Assessment & Plan Note (Signed)
Still with ongoing wheezing despite spiriva conmed change between d7 and d15 research visits  Plan [facility]

## 2016-03-19 ENCOUNTER — Inpatient Hospital Stay: Payer: Medicare Other | Admitting: Adult Health

## 2016-03-19 NOTE — Telephone Encounter (Signed)
Called and spoke to pt's caregiver and was advised she will call back. Will await call.

## 2016-03-25 MED ORDER — LOSARTAN POTASSIUM 25 MG PO TABS: 25.0000 mg | ORAL_TABLET | Freq: Every day | ORAL | 1 refills | Status: DC

## 2016-03-25 NOTE — Telephone Encounter (Signed)
Called and spoke to pt's caregiver. Informed her of the results and recs per MR. Order placed and rx sent to preferred pharmacy. She verbalized understanding and denied any further questions or concerns at this time.

## 2016-04-02 DIAGNOSIS — Z006 Encounter for examination for normal comparison and control in clinical research program: Secondary | ICD-10-CM

## 2016-04-02 DIAGNOSIS — J09X2 Influenza due to identified novel influenza A virus with other respiratory manifestations: Secondary | ICD-10-CM

## 2016-04-02 NOTE — Progress Notes (Signed)
Title: A Randomized, Double-Blind, Placebo-Controlled Dose Ranging Study Evaluating the Safety Pharmacokinetics and Clinical Benefit of FLU-IGIV in Hospitalized Patients with Serious Influenza A infection. IA-001 (ClinicalTrials.gov Identifier: [id number], Protocol No: IA-001, [facility] Protocol #[id number])  RESEARCH SUBJECT. This research study is sponsored by [employer]   The investigational product is called NP-025 aka FLU-IGIV or anti-influenza immune globulin intravenous. It is produced from source plasma collected from [place] Education [place], environmentalood and Drug Administration (FDA) licensed plasma collection establishments from healthy donors who have recovered from influenza (convalescent) and/or were vaccinated against seasonal influenza strains. The plasma contains a relatively high concentration of polyclonal antibodies directed against seasonal influenza strains, specifically influenza A strains H1N1 ([place]) and H3N2 ([place]). It is a glycoprotein of 150-160 kilodaltons against Hemagluttinin (HA) and Neuraminidase (NA) surface proteins.   ...................................................................................................  Clinical Research Coordinator / Research RN note : This visit for Subject Quincy SimmondsRobert Elderkin with 01/14/1932 on 04/02/2016 for the above protocol is Visit/Encounter Day 30  and is for purpose of follow up visit. The consent for this encounter is under Protocol Version 3.0 and IS currently IRB approved. Subject accompanied by son-in-law, Ferdie PingJames Lindsay. Though subject is alert and oriented to self, subject does not have the mental status and emotional capacity to reconsent to study. Family has expressed continued interest and consent in Mr. Yetta BarreJones continuing as a study subject. Subject thanked for participation in research and contribution to science.   In this visit 04/02/2016 the subject will be evaluated by investigator named Kalman ShanMurali Ramaswamy, MD.  This research coordinator has verified that the investigator IS uptodate with his training logs.  Subject states he has been doing really well since his last visit. Per son-in-law, subject has more energy and is back to full resumption of baseline activities. All assessments were completed per protocol. Subject has had no new changes in health. Please refer to subject's paper source for further documentation.   Confirmed subject has an upcoming standard of care follow up visit for post-flu and ongoing wheeze. This is scheduled next week with [facility] Jamison [facility]estor [facility] - Subject/family made aware this is not a research visit, but is a standard of care visit. Day 60 Follow Up research visit to be scheduled at future date by this RN.  Signed by  Toula MoosFrances J Nahum Sherrer, RN, BSN Clinical Research Nurse [facility]  [place], [place] 10:37 AM 04/02/2016

## 2016-04-07 ENCOUNTER — Ambulatory Visit (INDEPENDENT_AMBULATORY_CARE_PROVIDER_SITE_OTHER)
Admission: RE | Admit: 2016-04-07 | Discharge: 2016-04-07 | Disposition: A | Discharge status: Home / Self Care | Payer: Medicare Other | Source: Ambulatory Visit | Attending: Pulmonary Disease | Admitting: Pulmonary Disease

## 2016-04-07 ENCOUNTER — Ambulatory Visit (INDEPENDENT_AMBULATORY_CARE_PROVIDER_SITE_OTHER): Payer: Medicare Other | Admitting: Pulmonary Disease

## 2016-04-07 ENCOUNTER — Emergency Department (HOSPITAL_COMMUNITY): Payer: Medicare Other

## 2016-04-07 ENCOUNTER — Encounter: Payer: Self-pay | Admitting: Pulmonary Disease

## 2016-04-07 ENCOUNTER — Observation Stay (HOSPITAL_COMMUNITY)
Admission: EM | Admit: 2016-04-07 | Discharge: 2016-04-08 | Disposition: A | Discharge status: Home / Self Care | Payer: Medicare Other | Attending: Internal Medicine | Admitting: Internal Medicine

## 2016-04-07 ENCOUNTER — Encounter (HOSPITAL_COMMUNITY): Payer: Self-pay | Admitting: Emergency Medicine

## 2016-04-07 DIAGNOSIS — Z79899 Other long term (current) drug therapy: Secondary | ICD-10-CM | POA: Diagnosis not present

## 2016-04-07 DIAGNOSIS — I2510 Atherosclerotic heart disease of native coronary artery without angina pectoris: Secondary | ICD-10-CM

## 2016-04-07 DIAGNOSIS — I110 Hypertensive heart disease with heart failure: Secondary | ICD-10-CM | POA: Insufficient documentation

## 2016-04-07 DIAGNOSIS — D649 Anemia, unspecified: Secondary | ICD-10-CM | POA: Diagnosis not present

## 2016-04-07 DIAGNOSIS — Z87891 Personal history of nicotine dependence: Secondary | ICD-10-CM | POA: Diagnosis not present

## 2016-04-07 DIAGNOSIS — J449 Chronic obstructive pulmonary disease, unspecified: Secondary | ICD-10-CM | POA: Insufficient documentation

## 2016-04-07 DIAGNOSIS — Z7982 Long term (current) use of aspirin: Secondary | ICD-10-CM | POA: Insufficient documentation

## 2016-04-07 DIAGNOSIS — Z7901 Long term (current) use of anticoagulants: Secondary | ICD-10-CM | POA: Diagnosis not present

## 2016-04-07 DIAGNOSIS — R55 Syncope and collapse: Secondary | ICD-10-CM | POA: Diagnosis not present

## 2016-04-07 DIAGNOSIS — R0609 Other forms of dyspnea: Secondary | ICD-10-CM

## 2016-04-07 DIAGNOSIS — I482 Chronic atrial fibrillation, unspecified: Secondary | ICD-10-CM | POA: Diagnosis present

## 2016-04-07 DIAGNOSIS — N3000 Acute cystitis without hematuria: Secondary | ICD-10-CM

## 2016-04-07 DIAGNOSIS — E860 Dehydration: Secondary | ICD-10-CM | POA: Diagnosis not present

## 2016-04-07 DIAGNOSIS — Z7902 Long term (current) use of antithrombotics/antiplatelets: Secondary | ICD-10-CM | POA: Insufficient documentation

## 2016-04-07 DIAGNOSIS — I10 Essential (primary) hypertension: Secondary | ICD-10-CM | POA: Diagnosis not present

## 2016-04-07 DIAGNOSIS — N390 Urinary tract infection, site not specified: Secondary | ICD-10-CM | POA: Diagnosis present

## 2016-04-07 DIAGNOSIS — I252 Old myocardial infarction: Secondary | ICD-10-CM | POA: Diagnosis not present

## 2016-04-07 DIAGNOSIS — E785 Hyperlipidemia, unspecified: Secondary | ICD-10-CM | POA: Insufficient documentation

## 2016-04-07 DIAGNOSIS — I5032 Chronic diastolic (congestive) heart failure: Secondary | ICD-10-CM | POA: Insufficient documentation

## 2016-04-07 LAB — URINALYSIS, ROUTINE W REFLEX MICROSCOPIC
Bilirubin Urine: NEGATIVE
GLUCOSE, UA: NEGATIVE mg/dL
KETONES UR: NEGATIVE mg/dL
NITRITE: POSITIVE — AB
PROTEIN: NEGATIVE mg/dL
Specific Gravity, Urine: 1.008 (ref 1.005–1.030)
pH: 5 (ref 5.0–8.0)

## 2016-04-07 LAB — BASIC METABOLIC PANEL
ANION GAP: 7 (ref 5–15)
BUN: 17 mg/dL (ref 6–20)
CO2: 26 mmol/L (ref 22–32)
Calcium: 8 mg/dL — ABNORMAL LOW (ref 8.9–10.3)
Chloride: 106 mmol/L (ref 101–111)
Creatinine, Ser: 1.06 mg/dL (ref 0.61–1.24)
GFR calc Af Amer: >60 mL/min (ref >60)
GFR calc non Af Amer: >60 mL/min (ref >60)
Glucose, Bld: 78 mg/dL (ref 65–99)
POTASSIUM: 4.2 mmol/L (ref 3.5–5.1)
SODIUM: 139 mmol/L (ref 135–145)

## 2016-04-07 LAB — CBC
HEMATOCRIT: 34 % — AB (ref 39.0–52.0)
HEMOGLOBIN: 10.5 g/dL — AB (ref 13.0–17.0)
MCH: 25.4 pg — ABNORMAL LOW (ref 26.0–34.0)
MCHC: 30.9 g/dL (ref 30.0–36.0)
MCV: 82.1 fL (ref 78.0–100.0)
Platelets: 129 10*3/uL — ABNORMAL LOW (ref 150–400)
RBC: 4.14 MIL/uL — ABNORMAL LOW (ref 4.22–5.81)
RDW: 22.3 % — ABNORMAL HIGH (ref 11.5–15.5)
WBC: 4.5 10*3/uL (ref 4.0–10.5)

## 2016-04-07 MED ORDER — DEXTROSE 5 % IV SOLN
1.0000 g | Freq: Once | INTRAVENOUS | Status: AC
  Administered 2016-04-07: 1 g via INTRAVENOUS
  Filled 2016-04-07: qty 10

## 2016-04-07 MED ORDER — SODIUM CHLORIDE 0.9 % IV SOLN
INTRAVENOUS | Status: DC
  Administered 2016-04-07: via INTRAVENOUS

## 2016-04-07 NOTE — ED Notes (Signed)
i-stat troponin never crossed over, 0.00

## 2016-04-07 NOTE — Patient Instructions (Signed)
   Continue using your inhaler medications as prescribed.  We will review your test results when I see you back.  Call me if you have any new breathing problems or questions before your next appointment.  TESTS ORDERED: 1. Full PFTs before next appointment 2. on room air before next appointment 3. CXR PA/LAT today

## 2016-04-07 NOTE — Progress Notes (Signed)
Subjective:    Patient ID: Jose Lowery, male    DOB: 08/08/1932, 83 y.o.   MRN: [id number]  HPI The patient was hospitalized in February with Influenza A. He admits he limits his activity due to his dyspnea on exertion. He reports he is able to walk the 10-20 yards to his mailbox and back without stopping. He denies any coughing or wheezing. No chest pain, pressure, or tightness. He denies any prior bronchitis. No breathing problems as a child or young adult. Denies any seasonal sinus congestion, pressure, or drainage. He denies any reflux or dyspepsia. He does report infrequent morning brash water taste. No rashes or abnormal bruising. He reports good quality of sleep at night. He does nap in the daytime. The patient quit smoking just prior to his hospitalization. He does use his albuterol rescue inhaler 1-2 times daily and feels it does help. He started on Spiriva in February and believes he may be breathing better with it. He has finished Prednisone.   Review of Systems No fever, chills, or sweats. No abdominal pain, nausea or emesis. A pertinent 14 point review of systems is negative except as per the history of presenting illness.  No Known Allergies  Current Outpatient Prescriptions on File Prior to Visit  Medication Sig Dispense Refill  . albuterol (PROVENTIL HFA;VENTOLIN HFA) 108 (90 Base) MCG/ACT inhaler Inhale 2 puffs into the lungs every 6 (six) hours as needed for wheezing or shortness of breath.    Marland Kitchen. albuterol (PROVENTIL) (2.5 MG/3ML) 0.083% nebulizer solution Take 3 mLs (2.5 mg total) by nebulization every 6 (six) hours as needed for wheezing or shortness of breath. 75 mL 0  . amLODipine (NORVASC) 10 MG tablet Take 10 mg by mouth daily.    Marland Kitchen. aspirin 81 MG chewable tablet Chew 81 mg by mouth daily.    Marland Kitchen. atorvastatin (LIPITOR) 80 MG tablet Take 80 mg by mouth daily.    . carvedilol (COREG) 12.5 MG tablet Take 12.5 mg by mouth 2 (two) times daily with a meal.    . furosemide (LASIX)  40 MG tablet Take 1 tablet (40 mg total) by mouth daily. 30 tablet 0  . ipratropium (ATROVENT) 0.02 % nebulizer solution Take 2.5 mLs (0.5 mg total) by nebulization 4 (four) times daily. 75 mL 0  . losartan (COZAAR) 25 MG tablet Take 1 tablet (25 mg total) by mouth daily. 30 tablet 1  . magnesium oxide (MAG-OX) 400 (241.3 Mg) MG tablet Take 1 tablet (400 mg total) by mouth daily. 30 tablet 0  . nitroGLYCERIN (NITROSTAT) 0.4 MG SL tablet Place 0.4 mg under the tongue every 5 (five) minutes as needed for chest pain.    . pantoprazole (PROTONIX) 20 MG tablet Take 20 mg by mouth 2 (two) times daily.    . tamsulosin (FLOMAX) 0.4 MG CAPS capsule Take 0.4 mg by mouth.    . ticagrelor (BRILINTA) 90 MG TABS tablet Take 90 mg by mouth 2 (two) times daily.    Marland Kitchen. tiotropium (SPIRIVA) 18 MCG inhalation capsule Place 18 mcg into inhaler and inhale daily.    Marland Kitchen. warfarin (COUMADIN) 2 MG tablet Take 2 mg by mouth daily. As directed by coumadin clinic    . predniSONE (DELTASONE) 5 MG tablet Label  & dispense according to the schedule below. 10 Pills PO for 3 days then, 8 Pills PO for 3 days, 6 Pills PO for 3 days, 4 Pills PO for 3 days, 2 Pills PO for 3 days, 1 Pills PO  for 3 days, 1/2 Pill  PO for 3 days then STOP. Total 95 pills. (Patient not taking: Reported on 04/07/2016) 95 tablet 0   No current facility-administered medications on file prior to visit.     Past Medical History:  Diagnosis Date  . COPD (chronic obstructive pulmonary disease) (HCC)   . Hyperlipidemia   . Hypertension   . Myocardial infarction   . Prosthetic eye globe    left    Past Surgical History:  Procedure Laterality Date  . CORONARY ANGIOPLASTY WITH STENT PLACEMENT      Family History  Problem Relation Age of Onset  . Lung disease Neg Hx   . Cancer Neg Hx     Social History   Social History  . Marital status: Single    Spouse name: N/A  . Number of children: N/A  . Years of education: N/A   Social History Main Topics    . Smoking status: Former Smoker    Packs/day: 0.50    Years: 66.00    Types: Cigars, Cigarettes    Start date: 11/18/1950    Quit date: 03/01/2016  . Smokeless tobacco: Former Neurosurgeon    Types: Chew     Comment: Smoked 3 small cigars daily / Cigarettes was 0.5ppd  . Alcohol use Yes     Comment: beer occasionally  . Drug use: No  . Sexual activity: Not Asked   Other Topics Concern  . None   Social History Narrative   [facility] (04/07/16):   Originally from [place]. Previously also lived in [place]. Moved to live with his son-in-law & daughter in 2008. 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 No mold or bird exposure.       Objective:   Physical Exam BP 122/86 (BP Location: Right Arm, Patient Position: Sitting, Cuff Size: Normal)   Pulse 87   Ht 6\' 3"  (1.905 m)   Wt 190 lb 12.8 oz (86.5 kg)   SpO2 98%   BMI 23.85 kg/m   General:  Awake. Alert. No acute distress. Accompanied by his son-in-law today. Integument:  Warm & dry. No rash on exposed skin. No bruising. Extremities:  No cyanosis or clubbing.  Lymphatics:  No appreciated cervical or supraclavicular lymphadenoapthy. HEENT:  Moist mucus membranes. No oral ulcers. No scleral injection or icterus.  Cardiovascular:  Regular rate. No edema. Mild systolic ejection murmur appreciated at the aortic position.  Pulmonary:  Patchy mild bilateral wheezing. Otherwise good aeration. Symmetric chest wall expansion. No accessory muscle use. Abdomen: Soft. Normal bowel sounds. Nondistended. Grossly nontender. Musculoskeletal:  Normal bulk and tone. Hand grip strength 5/5 bilaterally. No joint deformity or effusion appreciated. Neurological:  CN 2-12 grossly in tact. No meningismus. Moving all 4 extremities equally. Psychiatric:  Mood and affect congruent. Speech normal rhythm, rate & tone.   IMAGING PORT CXR 03/08/16 (personally reviewed by me): Increased interstitial prominence within right mid and lower lung zones. No other focal  opacification appreciated. No pleural effusion. Suggestion of cardiomegaly.  CARDIAC TTE (03/06/16): Moderate concentric LVH. EF 60-65% with suggestion of diastolic dysfunction. LA & RA moderately dilated. Mild aortic regurgitation with very mild aortic stenosis. Mild dilatation of aortic root. Trivial mitral regurgitation without stenosis. Mild tricuspid regurgitation. No pericardial effusion.    Assessment & Plan:  83 y.o. male hospitalized in February for influenza pneumonia. Patient's dyspnea on exertion could certainly be due to COPD given his long-standing prior history of tobacco use. We will need to assess for possible residual opacity  with x-ray imaging today. He will require pulmonary function testing to better assess his lung function as well. I congratulated patient on having quit smoking and encouraged him to continue to abstain. He will contact me if he has any new breathing problems or questions before his next appointment.  1. Dyspnea on exertion: Checking full pulmonary function testing as well as 6 minute walk test on room air before next appointment. Continuing inhaler medications as prescribed. 2. Influenza pneumonia: Checking chest x-ray PA/LAT today. 3. Follow-up: Patient to return to clinic in 6 weeks or sooner if needed.  Donna Christen Jamison Neighbor, M.D. [facility] Pager:  [phone] After 3pm or if no response, call [phone] 2:07 PM 04/07/16

## 2016-04-07 NOTE — ED Triage Notes (Signed)
BIB EMS from home, pt has witnessed syncopal episode while sitting in chair, lasted approx 10 min. Pt was still unresponsive when EMS arrived. Pt was diaphoretic and pale, initial CP 80/50, received 500 bolus NS with EMS and pressures up to 112/76.

## 2016-04-07 NOTE — ED Provider Notes (Signed)
[facility]-EMERGENCY DEPT Provider Note   CSN: [id number] Arrival date & time: 04/07/16  2041     History   Chief Complaint Chief Complaint  Patient presents with  . Loss of Consciousness    HPI Jose Lowery is a 83 y.o. male.  Patient with syncopal episode at home. Patient was sitting in a chair when it occurred. EMS thought it lasted 10 minutes; it was shorter. Family did not witness the entire thing. EMS stated that when they arrived he was still unresponsive. Patient was diaphoretic and pale. Blood pressure was systolic of 80 received 500 mL bolus of normal saline pressure came up to 112. Patient states that right before hand he had slight headache his vision got blurry and things seemed to get dizzy.      Past Medical History:  Diagnosis Date  . COPD (chronic obstructive pulmonary disease) (HCC)   . Hyperlipidemia   . Hypertension   . Myocardial infarction   . Prosthetic eye globe    left    Patient Active Problem List   Diagnosis Date Noted  . Dyspnea on exertion 04/07/2016  . Prosthetic eye globe   . Essential hypertension 03/06/2016  . Chronic atrial fibrillation (HCC) 03/06/2016  . Research subject 03/06/2016    Past Surgical History:  Procedure Laterality Date  . CORONARY ANGIOPLASTY WITH STENT PLACEMENT         Home Medications    Prior to Admission medications   Medication Sig Start Date End Date Taking? Authorizing Provider  albuterol (PROVENTIL HFA;VENTOLIN HFA) 108 (90 Base) MCG/ACT inhaler Inhale 2 puffs into the lungs every 6 (six) hours as needed for wheezing or shortness of breath.    Historical Provider, MD  albuterol (PROVENTIL) (2.5 MG/3ML) 0.083% nebulizer solution Take 3 mLs (2.5 mg total) by nebulization every 6 (six) hours as needed for wheezing or shortness of breath. 03/08/16   Leroy Sea, MD  amLODipine (NORVASC) 10 MG tablet Take 10 mg by mouth daily.    Historical Provider, MD  aspirin 81 MG chewable tablet Chew 81 mg by mouth  daily.    Historical Provider, MD  atorvastatin (LIPITOR) 80 MG tablet Take 80 mg by mouth daily.    Historical Provider, MD  carvedilol (COREG) 12.5 MG tablet Take 12.5 mg by mouth 2 (two) times daily with a meal.    Historical Provider, MD  furosemide (LASIX) 40 MG tablet Take 1 tablet (40 mg total) by mouth daily. 03/08/16   Leroy Sea, MD  ipratropium (ATROVENT) 0.02 % nebulizer solution Take 2.5 mLs (0.5 mg total) by nebulization 4 (four) times daily. 03/08/16   Leroy Sea, MD  losartan (COZAAR) 25 MG tablet Take 1 tablet (25 mg total) by mouth daily. 03/25/16   Kalman Shan, MD  magnesium oxide (MAG-OX) 400 (241.3 Mg) MG tablet Take 1 tablet (400 mg total) by mouth daily. 03/08/16   Leroy Sea, MD  nitroGLYCERIN (NITROSTAT) 0.4 MG SL tablet Place 0.4 mg under the tongue every 5 (five) minutes as needed for chest pain.    Historical Provider, MD  pantoprazole (PROTONIX) 20 MG tablet Take 20 mg by mouth 2 (two) times daily.    Historical Provider, MD  predniSONE (DELTASONE) 5 MG tablet Label  & dispense according to the schedule below. 10 Pills PO for 3 days then, 8 Pills PO for 3 days, 6 Pills PO for 3 days, 4 Pills PO for 3 days, 2 Pills PO for 3 days, 1 Pills PO for  3 days, 1/2 Pill  PO for 3 days then STOP. Total 95 pills. Patient not taking: Reported on 04/07/2016 03/08/16   Leroy Sea, MD  tamsulosin (FLOMAX) 0.4 MG CAPS capsule Take 0.4 mg by mouth.    Historical Provider, MD  ticagrelor (BRILINTA) 90 MG TABS tablet Take 90 mg by mouth 2 (two) times daily.    Historical Provider, MD  tiotropium (SPIRIVA) 18 MCG inhalation capsule Place 18 mcg into inhaler and inhale daily.    Historical Provider, MD  warfarin (COUMADIN) 2 MG tablet Take 2 mg by mouth daily. As directed by coumadin clinic    Historical Provider, MD    Family History Family History  Problem Relation Age of Onset  . Lung disease Neg Hx   . Cancer Neg Hx     Social History Social History    Substance Use Topics  . Smoking status: Former Smoker    Packs/day: 0.50    Years: 66.00    Types: Cigars, Cigarettes    Start date: 11/18/1950    Quit date: 03/01/2016  . Smokeless tobacco: Former Neurosurgeon    Types: Chew     Comment: Smoked 3 small cigars daily / Cigarettes was 0.5ppd  . Alcohol use Yes     Comment: beer occasionally     Allergies   Patient has no known allergies.   Review of Systems Review of Systems  Constitutional: Positive for diaphoresis. Negative for fever.  HENT: Negative for congestion.   Eyes: Positive for visual disturbance.  Respiratory: Negative for shortness of breath.   Cardiovascular: Negative for chest pain.  Gastrointestinal: Negative for abdominal pain.  Genitourinary: Negative for dysuria.  Musculoskeletal: Negative for back pain.  Skin: Negative for wound.  Neurological: Positive for syncope and headaches. Negative for speech difficulty.  Hematological: Bruises/bleeds easily.  Psychiatric/Behavioral: Negative for confusion.     Physical Exam Updated Vital Signs BP 111/68   Pulse 77   Temp 97.9 F (36.6 C) (Oral)   Resp 19   Ht 6\' 3"  (1.905 m)   Wt 86.2 kg   SpO2 99%   BMI 23.75 kg/m   Physical Exam  Constitutional: He is oriented to person, place, and time. He appears well-developed and well-nourished. No distress.  HENT:  Head: Normocephalic and atraumatic.  Mouth/Throat: Oropharynx is clear and moist.  Eyes: EOM are normal. Pupils are equal, round, and reactive to light.  Neck: Normal range of motion. Neck supple.  No posterior midline tenderness.  Cardiovascular: Normal rate, regular rhythm and normal heart sounds.   Pulmonary/Chest: Effort normal and breath sounds normal.  Abdominal: Soft. Bowel sounds are normal.  Musculoskeletal: Normal range of motion.  Neurological: He is alert and oriented to person, place, and time. No cranial nerve deficit. He exhibits normal muscle tone. Coordination normal.  Skin: Skin is  warm.  Nursing note and vitals reviewed.    ED Treatments / Results  Labs (all labs ordered are listed, but only abnormal results are displayed) Labs Reviewed  BASIC METABOLIC PANEL - Abnormal; Notable for the following:       Result Value   Calcium 8.0 (*)    All other components within normal limits  CBC - Abnormal; Notable for the following:    RBC 4.14 (*)    Hemoglobin 10.5 (*)    HCT 34.0 (*)    MCH 25.4 (*)    RDW 22.3 (*)    Platelets 129 (*)    All other components within normal limits  URINALYSIS, ROUTINE W REFLEX MICROSCOPIC - Abnormal; Notable for the following:    Hgb urine dipstick SMALL (*)    Nitrite POSITIVE (*)    Leukocytes, UA SMALL (*)    Bacteria, UA RARE (*)    Squamous Epithelial / LPF 0-5 (*)    All other components within normal limits  PROTIME-INR  I-STAT TROPOININ, ED   Results for orders placed or performed during the hospital encounter of 04/07/16  Basic metabolic panel  Result Value Ref Range   Sodium 139 135 - 145 mmol/L   Potassium 4.2 3.5 - 5.1 mmol/L   Chloride 106 101 - 111 mmol/L   CO2 26 22 - 32 mmol/L   Glucose, Bld 78 65 - 99 mg/dL   BUN 17 6 - 20 mg/dL   Creatinine, Ser 1.61 0.61 - 1.24 mg/dL   Calcium 8.0 (L) 8.9 - 10.3 mg/dL   GFR calc non Af Amer >60 >60 mL/min   GFR calc Af Amer >60 >60 mL/min   Anion gap 7 5 - 15  CBC  Result Value Ref Range   WBC 4.5 4.0 - 10.5 K/uL   RBC 4.14 (L) 4.22 - 5.81 MIL/uL   Hemoglobin 10.5 (L) 13.0 - 17.0 g/dL   HCT 09.6 (L) 04.5 - 40.9 %   MCV 82.1 78.0 - 100.0 fL   MCH 25.4 (L) 26.0 - 34.0 pg   MCHC 30.9 30.0 - 36.0 g/dL   RDW 81.1 (H) 91.4 - 78.2 %   Platelets 129 (L) 150 - 400 K/uL  Urinalysis, Routine w reflex microscopic  Result Value Ref Range   Color, Urine YELLOW YELLOW   APPearance CLEAR CLEAR   Specific Gravity, Urine 1.008 1.005 - 1.030   pH 5.0 5.0 - 8.0   Glucose, UA NEGATIVE NEGATIVE mg/dL   Hgb urine dipstick SMALL (A) NEGATIVE   Bilirubin Urine NEGATIVE NEGATIVE     Ketones, ur NEGATIVE NEGATIVE mg/dL   Protein, ur NEGATIVE NEGATIVE mg/dL   Nitrite POSITIVE (A) NEGATIVE   Leukocytes, UA SMALL (A) NEGATIVE   RBC / HPF 0-5 0 - 5 RBC/hpf   WBC, UA 0-5 0 - 5 WBC/hpf   Bacteria, UA RARE (A) NONE SEEN   Squamous Epithelial / LPF 0-5 (A) NONE SEEN   Mucous PRESENT    Hyaline Casts, UA PRESENT         EKG  EKG Interpretation  Date/Time:  Wednesday April 07 2016 20:50:00 EDT Ventricular Rate:  79 PR Interval:    QRS Duration: 101 QT Interval:  430 QTC Calculation: 493 R Axis:   -53 Text Interpretation:  Atrial fibrillation Ventricular premature complex Left anterior fascicular block Borderline prolonged QT interval Confirmed by Deretha Emory  MD, Javarius Tsosie [phone]) on 04/07/2016 10:49:27 PM       Radiology Dg Chest 2 View  Result Date: 04/07/2016 CLINICAL DATA:  Short of breath with exertion over the last month, former smoking history EXAM: CHEST  2 VIEW COMPARISON:  Chest x-ray of 03/08/2016 FINDINGS: The lungs remain hyperaerated. Mediastinal and hilar contours are unremarkable. The heart is within upper limits normal. No acute bony abnormality is seen. Mild thoracic aortic atherosclerosis is present. IMPRESSION: 1. No active cardiopulmonary disease.  Slight hyper aeration . 2. Mild thoracic aortic atherosclerosis Electronically Signed   By: Dwyane Dee M.D.   On: 04/07/2016 15:09    Procedures Procedures (including critical care time)  Medications Ordered in ED Medications  0.9 %  sodium chloride infusion (not administered)  Initial Impression / Assessment and Plan / ED Course  I have reviewed the triage vital signs and the nursing notes.  Pertinent labs & imaging results that were available during my care of the patient were reviewed by me and considered in my medical decision making (see chart for details).     Patient with unexplained syncopal episode. Not witnessed completely by family members. Patient did start to recover. EMS  stated he was still unresponsive and they arrived. He is diaphoretic and pale. Initial blood pressure was 80/50. Received 500 mL bolus normal saline and bulb and blood pressures became normal. Patient is now back to baseline. He remembers feeling kind of dizzy vision going in complaint of slight headache not a severe headache.   Patient lab workup without significant abnormalities other than evidence of probably urinary tract infection. Urine culture. Patient started on Rocephin. Patient will require admission for the syncopal episode.    the patient will require admission for the syncopal episode.     Final Clinical Impressions(s) / ED Diagnoses   Final diagnoses:  Syncope, unspecified syncope type    New Prescriptions New Prescriptions   No medications on file     Vanetta MuldersScott Bryli Mantey, MD 04/07/16 [id number]

## 2016-04-08 DIAGNOSIS — E860 Dehydration: Secondary | ICD-10-CM | POA: Diagnosis not present

## 2016-04-08 DIAGNOSIS — R55 Syncope and collapse: Secondary | ICD-10-CM | POA: Diagnosis not present

## 2016-04-08 DIAGNOSIS — I5032 Chronic diastolic (congestive) heart failure: Secondary | ICD-10-CM | POA: Diagnosis present

## 2016-04-08 DIAGNOSIS — N3000 Acute cystitis without hematuria: Secondary | ICD-10-CM | POA: Diagnosis not present

## 2016-04-08 DIAGNOSIS — I2510 Atherosclerotic heart disease of native coronary artery without angina pectoris: Secondary | ICD-10-CM | POA: Diagnosis present

## 2016-04-08 DIAGNOSIS — I482 Chronic atrial fibrillation: Secondary | ICD-10-CM | POA: Diagnosis not present

## 2016-04-08 LAB — BASIC METABOLIC PANEL
ANION GAP: 5 (ref 5–15)
BUN: 15 mg/dL (ref 6–20)
CO2: 27 mmol/L (ref 22–32)
Calcium: 8 mg/dL — ABNORMAL LOW (ref 8.9–10.3)
Chloride: 107 mmol/L (ref 101–111)
Creatinine, Ser: 0.84 mg/dL (ref 0.61–1.24)
GFR calc non Af Amer: >60 mL/min (ref >60)
GLUCOSE: 95 mg/dL (ref 65–99)
POTASSIUM: 3.5 mmol/L (ref 3.5–5.1)
Sodium: 139 mmol/L (ref 135–145)

## 2016-04-08 LAB — GLUCOSE, CAPILLARY
GLUCOSE-CAPILLARY: 90 mg/dL (ref 65–99)
GLUCOSE-CAPILLARY: 93 mg/dL (ref 65–99)
GLUCOSE-CAPILLARY: 121 mg/dL — AB (ref 65–99)
Glucose-Capillary: 119 mg/dL — ABNORMAL HIGH (ref 65–99)

## 2016-04-08 LAB — PROTIME-INR
INR: 1.34
Prothrombin Time: 16.7 seconds — ABNORMAL HIGH (ref 11.4–15.2)

## 2016-04-08 LAB — POCT I-STAT TROPONIN I: TROPONIN I, POC: 0 ng/mL (ref 0.00–0.08)

## 2016-04-08 MED ORDER — PANTOPRAZOLE SODIUM 20 MG PO TBEC
20.0000 mg | DELAYED_RELEASE_TABLET | Freq: Two times a day (BID) | ORAL | Status: DC
  Administered 2016-04-08 (×2): 20 mg via ORAL
  Filled 2016-04-08 (×2): qty 1

## 2016-04-08 MED ORDER — CARVEDILOL 12.5 MG PO TABS
12.5000 mg | ORAL_TABLET | Freq: Two times a day (BID) | ORAL | Status: DC
  Administered 2016-04-08: 12.5 mg via ORAL
  Filled 2016-04-08: qty 1

## 2016-04-08 MED ORDER — ATORVASTATIN CALCIUM 80 MG PO TABS
80.0000 mg | ORAL_TABLET | Freq: Every day | ORAL | Status: DC
  Administered 2016-04-08: 80 mg via ORAL
  Filled 2016-04-08: qty 1

## 2016-04-08 MED ORDER — WARFARIN SODIUM 4 MG PO TABS
4.0000 mg | ORAL_TABLET | Freq: Once | ORAL | Status: DC
  Filled 2016-04-08: qty 1

## 2016-04-08 MED ORDER — WARFARIN - PHARMACIST DOSING INPATIENT: Freq: Every day | Status: DC

## 2016-04-08 MED ORDER — SODIUM CHLORIDE 0.9% FLUSH
3.0000 mL | Freq: Two times a day (BID) | INTRAVENOUS | Status: DC
  Administered 2016-04-08: 3 mL via INTRAVENOUS

## 2016-04-08 MED ORDER — CEFUROXIME AXETIL 500 MG PO TABS
250.0000 mg | ORAL_TABLET | Freq: Two times a day (BID) | ORAL | Status: DC
  Administered 2016-04-08: 250 mg via ORAL
  Filled 2016-04-08: qty 1

## 2016-04-08 MED ORDER — ACETAMINOPHEN 325 MG PO TABS: 650.0000 mg | ORAL_TABLET | Freq: Four times a day (QID) | ORAL | Status: DC | PRN

## 2016-04-08 MED ORDER — SODIUM CHLORIDE 0.9 % IV SOLN
INTRAVENOUS | Status: DC
  Administered 2016-04-08: 02:00:00 via INTRAVENOUS

## 2016-04-08 MED ORDER — ACETAMINOPHEN 650 MG RE SUPP: 650.0000 mg | Freq: Four times a day (QID) | RECTAL | Status: DC | PRN

## 2016-04-08 MED ORDER — ENOXAPARIN SODIUM 120 MG/0.8ML ~~LOC~~ SOLN: 120.0000 mg | SUBCUTANEOUS | 0 refills | Status: AC

## 2016-04-08 MED ORDER — ASPIRIN 81 MG PO CHEW
81.0000 mg | CHEWABLE_TABLET | Freq: Every day | ORAL | Status: DC
  Administered 2016-04-08: 81 mg via ORAL
  Filled 2016-04-08: qty 1

## 2016-04-08 MED ORDER — IPRATROPIUM-ALBUTEROL 0.5-2.5 (3) MG/3ML IN SOLN
3.0000 mL | Freq: Four times a day (QID) | RESPIRATORY_TRACT | Status: DC
  Administered 2016-04-08 (×2): 3 mL via RESPIRATORY_TRACT
  Filled 2016-04-08 (×2): qty 3

## 2016-04-08 MED ORDER — HYDROCODONE-ACETAMINOPHEN 5-325 MG PO TABS: 1.0000 | ORAL_TABLET | ORAL | Status: DC | PRN

## 2016-04-08 MED ORDER — TICAGRELOR 90 MG PO TABS
90.0000 mg | ORAL_TABLET | Freq: Two times a day (BID) | ORAL | Status: DC
  Administered 2016-04-08 (×2): 90 mg via ORAL
  Filled 2016-04-08 (×2): qty 1

## 2016-04-08 MED ORDER — CEFUROXIME AXETIL 250 MG PO TABS: 250.0000 mg | ORAL_TABLET | Freq: Two times a day (BID) | ORAL | 0 refills | Status: AC

## 2016-04-08 MED ORDER — ENOXAPARIN SODIUM 120 MG/0.8ML ~~LOC~~ SOLN: 120.0000 mg | SUBCUTANEOUS | 0 refills | Status: DC

## 2016-04-08 MED ORDER — ONDANSETRON HCL 4 MG PO TABS: 4.0000 mg | ORAL_TABLET | Freq: Four times a day (QID) | ORAL | Status: DC | PRN

## 2016-04-08 MED ORDER — ONDANSETRON HCL 4 MG/2ML IJ SOLN: 4.0000 mg | Freq: Four times a day (QID) | INTRAMUSCULAR | Status: DC | PRN

## 2016-04-08 MED ORDER — ALBUTEROL SULFATE (2.5 MG/3ML) 0.083% IN NEBU: 2.5000 mg | INHALATION_SOLUTION | RESPIRATORY_TRACT | Status: DC | PRN

## 2016-04-08 MED ORDER — DEXTROSE 5 % IV SOLN: 1.0000 g | INTRAVENOUS | Status: DC

## 2016-04-08 MED ORDER — CARVEDILOL 6.25 MG PO TABS: 6.2500 mg | ORAL_TABLET | Freq: Two times a day (BID) | ORAL | 0 refills | Status: AC

## 2016-04-08 MED ORDER — ENOXAPARIN SODIUM 120 MG/0.8ML ~~LOC~~ SOLN
120.0000 mg | SUBCUTANEOUS | Status: DC
  Filled 2016-04-08: qty 0.8

## 2016-04-08 MED ORDER — POLYETHYLENE GLYCOL 3350 17 G PO PACK
17.0000 g | PACK | Freq: Every day | ORAL | Status: DC | PRN
Start: 2016-04-08 — End: 2016-04-08

## 2016-04-08 MED ORDER — MAGNESIUM OXIDE 400 (241.3 MG) MG PO TABS
400.0000 mg | ORAL_TABLET | Freq: Every day | ORAL | Status: DC
  Administered 2016-04-08: 400 mg via ORAL
  Filled 2016-04-08: qty 1

## 2016-04-08 MED ORDER — ALBUTEROL SULFATE (2.5 MG/3ML) 0.083% IN NEBU
2.5000 mg | INHALATION_SOLUTION | RESPIRATORY_TRACT | Status: DC | PRN
  Administered 2016-04-08 (×2): 2.5 mg via RESPIRATORY_TRACT
  Filled 2016-04-08 (×2): qty 3

## 2016-04-08 MED ORDER — BISACODYL 5 MG PO TBEC
5.0000 mg | DELAYED_RELEASE_TABLET | Freq: Every day | ORAL | Status: DC | PRN
  Filled 2016-04-08: qty 1

## 2016-04-08 MED ORDER — IPRATROPIUM BROMIDE 0.02 % IN SOLN
0.5000 mg | Freq: Four times a day (QID) | RESPIRATORY_TRACT | Status: DC
  Administered 2016-04-08: 0.5 mg via RESPIRATORY_TRACT
  Filled 2016-04-08: qty 2.5

## 2016-04-08 MED ORDER — CARVEDILOL 6.25 MG PO TABS
6.2500 mg | ORAL_TABLET | Freq: Two times a day (BID) | ORAL | Status: DC
  Administered 2016-04-08: 6.25 mg via ORAL
  Filled 2016-04-08: qty 1

## 2016-04-08 MED ORDER — TAMSULOSIN HCL 0.4 MG PO CAPS
0.4000 mg | ORAL_CAPSULE | Freq: Every day | ORAL | Status: DC
  Administered 2016-04-08: 0.4 mg via ORAL
  Filled 2016-04-08: qty 1

## 2016-04-08 NOTE — Progress Notes (Signed)
Called patient's number listed on file was contacted for results. Being patient is hospitalized, daughter was given results; she is listed on DPR. She verbalized understanding. Nothing further is needed.

## 2016-04-08 NOTE — Discharge Summary (Addendum)
Physician Discharge Summary  Jose Lowery BJY:[id number] DOB: 05-Jun-1932 DOA: 04/07/2016  PCP: Patria Mane, MD  Admit date: 04/07/2016 Discharge date: 04/08/2016   Recommendations for Outpatient Follow-Up:   1. INR Monday (lovenox bridge for 5 days given)-- INR here 1.34 (4 mg given on Thursday night- 2mg  nightly after) 2. BP check on Monday as well- may need held medications resumed 3. Family would like referral to [facility] heartcare for future cardiac needs    Discharge Diagnosis:   Active Problems:   Essential hypertension   Chronic atrial fibrillation (HCC)   COPD (chronic obstructive pulmonary disease) (HCC)   Normocytic anemia   UTI (urinary tract infection)   CAD (coronary artery disease)   Chronic diastolic CHF (congestive heart failure) (HCC)   Discharge disposition:  Home with family:  Discharge Condition: Improved.  Diet recommendation: Low sodium, heart healthy  Wound care: None.   History of Present Illness:    Jose Lowery is a 83 y.o. male with medical history significant for hypertension, hyperlipidemia, coronary artery disease with stent, COPD, chronic diastolic CHF, and atrial fibrillation on warfarin who presents to the emergency department following a syncopal episode at home. Patient reports that he was in his usual state of health throughout the day and was sitting in a chair at home tonight when he developed some blurring in his vision, was having difficulty seeing the TV, and then became acutely lightheaded and apparently experienced a transient loss of consciousness. Family member was reportedly present and called EMS. Patient was found to be poorly responsive by EMS, reportedly hale and clammy with blood pressure of 80/50. He was given 500 mL of normal saline en route to the hospital. Patient denies any recent chest pain or palpitations and denies any swelling or tenderness in the lower extremity. There has been no significant change in his cough and his  chronic dyspnea is largely unchanged as well. There has been no recent fevers or chills and no abdominal pain, vomiting, or diarrhea. Patient was discharged from the hospital approximately one month ago after treatment for influenza A with exacerbations in COPD and CHF. He was discharged home with new prescription for Lasix.    Hospital Course by Problem:   UTI -culture pending -ceftin given x 7 days  Discussed with family: patient did NOT synopsize.  He was wearing a coat in a warm house and had a beer earlier as well -patient was dry upon admission and after IVF his orthostatics were negative.   -echo d/'d as had echo last month  COPD -continue home nebs  Chronic a fib -INR low at 1.34 -[place] VASC2 high at 7 -will bridge with lovenox until inr > 2-- INR check on Monday  HTN -BP low normal here -hold meds and have PCP recheck on [place]  Anemia -outpatient follow up  Patient up walking with PT and appears to be back to his baseline    Medical Consultants:    None.   Discharge Exam:   Vitals:   04/08/16 1001 04/08/16 1437  BP: 118/72 (!) 122/55  Pulse: 72 68  Resp:  18  Temp:  98.7 F (37.1 C)   Vitals:   04/08/16 0916 04/08/16 1001 04/08/16 1259 04/08/16 1437  BP:  118/72  (!) 122/55  Pulse:  72  68  Resp:    18  Temp:    98.7 F (37.1 C)  TempSrc:    Oral  SpO2: 98%  97% 100%  Weight:  Height:        Gen:  NAD    The results of significant diagnostics from this hospitalization (including imaging, microbiology, ancillary and laboratory) are listed below for reference.     Procedures and Diagnostic Studies:   Dg Chest 2 View  Result Date: 04/07/2016 CLINICAL DATA:  Short of breath with exertion over the last month, former smoking history EXAM: CHEST  2 VIEW COMPARISON:  Chest x-ray of 03/08/2016 FINDINGS: The lungs remain hyperaerated. Mediastinal and hilar contours are unremarkable. The heart is within upper limits normal. No acute bony  abnormality is seen. Mild thoracic aortic atherosclerosis is present. IMPRESSION: 1. No active cardiopulmonary disease.  Slight hyper aeration . 2. Mild thoracic aortic atherosclerosis Electronically Signed   By: Dwyane DeePaul  Barry M.D.   On: 04/07/2016 15:09   Ct Head Wo Contrast  Result Date: 04/07/2016 CLINICAL DATA:  83 y/o  M; syncope. EXAM: CT HEAD WITHOUT CONTRAST TECHNIQUE: Contiguous axial images were obtained from the base of the skull through the vertex without intravenous contrast. COMPARISON:  None. FINDINGS: Brain: No evidence of acute infarction, hemorrhage, hydrocephalus, extra-axial collection or mass lesion/mass effect. Few nonspecific foci of hypoattenuation in subcortical white matter are compatible with mild chronic microvascular ischemic changes. Small left posterior parietal chronic appearing cortical infarction. Vascular: Severe calcific atherosclerosis of cavernous and paraclinoid internal carotid arteries. Skull: Normal. Negative for fracture or focal lesion. Sinuses/Orbits: Mild ethmoid and right maxillary sinus mucosal thickening. Normal aeration of mastoid air cells. 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 Other: None. IMPRESSION: 1. No acute intracranial abnormality identified. 2. Mild chronic microvascular ischemic changes of the brain 3. 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 4. Mild paranasal sinus disease. Electronically Signed   By: Mitzi HansenLance  Furusawa-Stratton M.D.   On: 04/07/2016 23:55     Labs:   Basic Metabolic Panel:  Recent Labs Lab 04/07/16 2055 04/08/16 0713  NA 139 139  K 4.2 3.5  CL 106 107  CO2 26 27  GLUCOSE 78 95  BUN 17 15  CREATININE 1.06 0.84  CALCIUM 8.0* 8.0*   GFR Estimated Creatinine Clearance: 82.4 mL/min (by C-G formula based on SCr of 0.84 mg/dL). Liver Function Tests: No results for input(s): AST, ALT, ALKPHOS, BILITOT, PROT, ALBUMIN in the last 168 hours. No results for input(s): LIPASE,  AMYLASE in the last 168 hours. No results for input(s): AMMONIA in the last 168 hours. Coagulation profile  Recent Labs Lab 04/08/16 0713  INR 1.34    CBC:  Recent Labs Lab 04/07/16 2055  WBC 4.5  HGB 10.5*  HCT 34.0*  MCV 82.1  PLT 129*   Cardiac Enzymes: No results for input(s): CKTOTAL, CKMB, CKMBINDEX, TROPONINI in the last 168 hours. BNP: Invalid input(s): POCBNP CBG:  Recent Labs Lab 04/07/16 2100 04/08/16 0657 04/08/16 1105 04/08/16 1644  GLUCAP 90 93 121* 119*   D-Dimer No results for input(s): DDIMER in the last 72 hours. Hgb A1c No results for input(s): HGBA1C in the last 72 hours. Lipid Profile No results for input(s): CHOL, HDL, LDLCALC, TRIG, CHOLHDL, LDLDIRECT in the last 72 hours. Thyroid function studies No results for input(s): TSH, T4TOTAL, T3FREE, THYROIDAB in the last 72 hours.  Invalid input(s): FREET3 Anemia work up No results for input(s): VITAMINB12, FOLATE, FERRITIN, TIBC, IRON, RETICCTPCT in the last 72 hours. Microbiology No results found for this or any previous visit (from the past 240 hour(s)).   Discharge Instructions:   Discharge Instructions  Diet - low sodium heart healthy    Complete by:  As directed    Discharge instructions    Complete by:  As directed    INR check on Monday with BP check Use lovenox and coumadin to bridge until INR > 2 (prescription given for 5 days)   Increase activity slowly    Complete by:  As directed      Allergies as of 04/08/2016   No Known Allergies     Medication List    STOP taking these medications   amLODipine 10 MG tablet Commonly known as:  NORVASC   furosemide 40 MG tablet Commonly known as:  LASIX   losartan 25 MG tablet Commonly known as:  COZAAR     TAKE these medications   albuterol 108 (90 Base) MCG/ACT inhaler Commonly known as:  PROVENTIL HFA;VENTOLIN HFA Inhale 2 puffs into the lungs every 6 (six) hours as needed for wheezing or shortness of breath.     albuterol (2.5 MG/3ML) 0.083% nebulizer solution Commonly known as:  PROVENTIL Take 3 mLs (2.5 mg total) by nebulization every 6 (six) hours as needed for wheezing or shortness of breath.   aspirin 81 MG chewable tablet Chew 81 mg by mouth daily.   atorvastatin 80 MG tablet Commonly known as:  LIPITOR Take 80 mg by mouth every evening.   carvedilol 6.25 MG tablet Commonly known as:  COREG Take 1 tablet (6.25 mg total) by mouth 2 (two) times daily with a meal. What changed:  medication strength  how much to take   cefUROXime 250 MG tablet Commonly known as:  CEFTIN Take 1 tablet (250 mg total) by mouth 2 (two) times daily with a meal.   enoxaparin 120 MG/0.8ML injection Commonly known as:  LOVENOX Inject 0.8 mLs (120 mg total) into the skin daily.   ipratropium 0.02 % nebulizer solution Commonly known as:  ATROVENT Take 2.5 mLs (0.5 mg total) by nebulization 4 (four) times daily.   magnesium oxide 400 (241.3 Mg) MG tablet Commonly known as:  MAG-OX Take 1 tablet (400 mg total) by mouth daily.   nitroGLYCERIN 0.4 MG SL tablet Commonly known as:  NITROSTAT Place 0.4 mg under the tongue every 5 (five) minutes as needed for chest pain.   pantoprazole 20 MG tablet Commonly known as:  PROTONIX Take 40 mg by mouth daily.   tamsulosin 0.4 MG Caps capsule Commonly known as:  FLOMAX Take 0.4 mg by mouth every evening.   tiotropium 18 MCG inhalation capsule Commonly known as:  SPIRIVA Place 18 mcg into inhaler and inhale daily.   warfarin 2 MG tablet Commonly known as:  COUMADIN Take 2 mg by mouth daily.      Follow-up Information    GASSEMI, MIKE, MD Follow up in 1 week(s).   Specialty:  Internal Medicine Why:  Monday for INR check and BP check-- may need to resume some BP meds Contact information: [address] [facility] [address] Internal [facility] [address] [phone]            Time coordinating discharge: 21  min  Signed:  JESSICA Juanetta Gosling   [facility] 04/08/2016, 5:23 PM

## 2016-04-08 NOTE — Discharge Instructions (Signed)

## 2016-04-08 NOTE — Progress Notes (Signed)
New Admission Note:   Arrival Method:  From ED via stretcher Mental Orientation: A&O Telemetry: Box 2w33 Assessment: Completed Skin: Intact IV: L AC Pain: headache rating 1/10 Tubes: None Safety Measures: Safety Fall Prevention Plan has been discussed  Admission 2 West Orientation: Patient has been orientated to the room, unit and staff.  Family: none present at bedside  Orders to be reviewed and implemented. Will continue to monitor the patient. Call light has been placed within reach and bed alarm has been activated. Tele box applied. CCMD notified    Gregor HamsAlisha Idonia Zollinger, RN

## 2016-04-08 NOTE — Progress Notes (Signed)
ANTICOAGULATION CONSULT NOTE - Follow Up Consult  Pharmacy Consult for Coumadin   Indication: Afib   No Known Allergies  Patient Measurements: Height: 6\' 4"  (193 cm) Weight: 186 lb 4.6 oz (84.5 kg) IBW/kg (Calculated) : 86.8   Vital Signs: Temp: 98.3 F (36.8 C) (03/29 0513) Temp Source: Oral (03/29 0513) BP: 118/72 (03/29 1001) Pulse Rate: 72 (03/29 1001)  Labs:  Recent Labs  04/07/16 2055 04/08/16 0713  HGB 10.5*  --   HCT 34.0*  --   PLT 129*  --   LABPROT  --  16.7*  INR  --  1.34  CREATININE 1.06 0.84   Estimated Creatinine Clearance: 82.4 mL/min (by C-G formula based on SCr of 0.84 mg/dL).  Medications:  Prescriptions Prior to Admission  Medication Sig Dispense Refill Last Dose  . albuterol (PROVENTIL HFA;VENTOLIN HFA) 108 (90 Base) MCG/ACT inhaler Inhale 2 puffs into the lungs every 6 (six) hours as needed for wheezing or shortness of breath.   04/07/2016 at Unknown time  . albuterol (PROVENTIL) (2.5 MG/3ML) 0.083% nebulizer solution Take 3 mLs (2.5 mg total) by nebulization every 6 (six) hours as needed for wheezing or shortness of breath. 75 mL 0 Unknown at Unknown  . amLODipine (NORVASC) 10 MG tablet Take 10 mg by mouth daily.   04/07/2016 at Unknown time  . aspirin 81 MG chewable tablet Chew 81 mg by mouth daily.   04/07/2016 at Unknown  . atorvastatin (LIPITOR) 80 MG tablet Take 80 mg by mouth every evening.    04/06/2016 at Unknown time  . carvedilol (COREG) 25 MG tablet Take 25 mg by mouth 2 (two) times daily with a meal.    04/07/2016 at 1000  . furosemide (LASIX) 40 MG tablet Take 1 tablet (40 mg total) by mouth daily. 30 tablet 0 04/07/2016 at Unknown time  . ipratropium (ATROVENT) 0.02 % nebulizer solution Take 2.5 mLs (0.5 mg total) by nebulization 4 (four) times daily. 75 mL 0 Unknown at Unknown  . losartan (COZAAR) 25 MG tablet Take 1 tablet (25 mg total) by mouth daily. 30 tablet 1 04/07/2016 at Unknown time  . magnesium oxide (MAG-OX) 400 (241.3 Mg) MG  tablet Take 1 tablet (400 mg total) by mouth daily. 30 tablet 0 04/07/2016 at Unknown  . nitroGLYCERIN (NITROSTAT) 0.4 MG SL tablet Place 0.4 mg under the tongue every 5 (five) minutes as needed for chest pain.   Unknown at Unknown  . pantoprazole (PROTONIX) 20 MG tablet Take 40 mg by mouth daily.    04/07/2016 at Unknown time  . tamsulosin (FLOMAX) 0.4 MG CAPS capsule Take 0.4 mg by mouth every evening.    04/06/2016 at Unknown time  . tiotropium (SPIRIVA) 18 MCG inhalation capsule Place 18 mcg into inhaler and inhale daily.   04/07/2016 at Unknown time  . warfarin (COUMADIN) 2 MG tablet Take 2 mg by mouth daily.    04/06/2016 at 1930   Assessment: 83yo male presents after syncopal event, with PMH atrial fibrillation (CHADS-VASc 7) on warfarin prior to admission. Last reported dose 04/06/16 with INR on admission subtherapeutic at 1.34. Spoke with MD, who verified PTA warfarin regimen, and decided to bridge the patient with once daily lovenox and boost his warfarin dose tonight. Upon discharge the patient should resume PTA regimen and follow up outpatient early next week. HgB 10.5 and Plt 129 - Down from prior admission, but no bleeding documented or reported.  Goal of Therapy:  INR 2-3 Monitor platelets by anticoagulation protocol: Yes  Plan:  Warfarin 4mg  x1 tonight Lovenox 120mg  SQ QD Monitor renal function and for s/sx of bleeding Daily INR  Ruben Imony Guilianna Mckoy, PharmD Clinical Pharmacist Pager: [phone] 04/08/2016 11:51 AM

## 2016-04-08 NOTE — Evaluation (Signed)
Physical Therapy Evaluation Patient Details Name: Jose Lowery MRN: [id number] DOB: 06-14-1932 Today's Date: 04/08/2016   History of Present Illness  83 y.o. male with medical history significant for hypertension, hyperlipidemia, coronary artery disease with stent, COPD, chronic diastolic CHF, atrial fibrillation, admission for flu 1 month ago who presented to the emergency department following a syncopal episode at home. Pt found to be hypotensive, ?UTI, dehydrated, afib. Orthostatics negative 04/08/16, imaging of head negative for acute infarct.   Clinical Impression  Pt ambulated 250' with IV pole (he normally uses a cane) without loss of balance, no dizziness, HR 105 max. He is mobilizing at his baseline of modified independence and does not need further PT, will sign off.   Of note, pt stated his birthdate is [birth year], chart states [birth year]. RN notified, she called family who stated there are historical discrepancies with patient's true birthdate.     Follow Up Recommendations No PT follow up    Equipment Recommendations  None recommended by PT    Recommendations for Other Services       Precautions / Restrictions Precautions Precautions: Fall Precaution Comments: denies falls in past year Restrictions Weight Bearing Restrictions: No      Mobility  Bed Mobility Overal bed mobility: Independent                Transfers Overall transfer level: Independent                  Ambulation/Gait Ambulation/Gait assistance: Modified independent (Device/Increase time) Ambulation Distance (Feet): 250 Feet Assistive device:  (IV pole RUE) Gait Pattern/deviations: WFL(Within Functional Limits)   Gait velocity interpretation: at or above normal speed for age/gender General Gait Details: steady, no LOB, HR 104 walking  Stairs            Wheelchair Mobility    Modified Rankin (Stroke Patients Only)       Balance Overall balance assessment: Modified Independent                                            Pertinent Vitals/Pain Pain Assessment: No/denies pain    Home Living Family/patient expects to be discharged to:: Private residence Living Arrangements: Other (Comment) Available Help at Discharge: Family;Available 24 hours/day Type of Home: House Home Access: Level entry     Home Layout: One level Home Equipment: Cane - single point Additional Comments: stated he lives with mother in law; 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"    Prior Function Level of Independence: Independent with assistive device(s)         Comments: walks with SPC, denies h/o falls, independent ADLs, children drive him to appointments and to get food     Hand Dominance        Extremity/Trunk Assessment   Upper Extremity Assessment Upper Extremity Assessment: Overall WFL for tasks assessed    Lower Extremity Assessment Lower Extremity Assessment: Overall WFL for tasks assessed       Communication   Communication: HOH  Cognition Arousal/Alertness: Awake/alert Behavior During Therapy: WFL for tasks assessed/performed Overall Cognitive Status: Within Functional Limits for tasks assessed                                        General Comments      Exercises  Assessment/Plan    PT Assessment Patent does not need any further PT services  PT Problem List         PT Treatment Interventions      PT Goals (Current goals can be found in the Care Plan section)  Acute Rehab PT Goals PT Goal Formulation: All assessment and education complete, DC therapy    Frequency     Barriers to discharge        Co-evaluation               End of Session Equipment Utilized During Treatment: Gait belt Activity Tolerance: Patient tolerated treatment well Patient left: in chair;with call bell/phone within reach Nurse Communication: Mobility status      Time: 1005-1025 PT Time Calculation (min) (ACUTE ONLY): 20  min   Charges:   PT Evaluation $PT Eval Low Complexity: 1 Procedure     PT G Codes:   PT G-Codes **NOT FOR INPATIENT CLASS** Functional Assessment Tool Used: AM-PAC 6 Clicks Basic Mobility Functional Limitation: Mobility: Walking and moving around Mobility: Walking and Moving Around Current Status ([id number]): At least 1 percent but less than 20 percent impaired, limited or restricted Mobility: Walking and Moving Around Goal Status [phone]): At least 1 percent but less than 20 percent impaired, limited or restricted     Tamala SerUhlenberg, Gabrella Stroh Kistler 04/08/2016, 10:35 AM [phone]

## 2016-04-08 NOTE — Care Management Note (Signed)
Case Management Note  Patient Details  Name: Jose SimmondsRobert Lowery MRN: [id number] Date of Birth: 06/26/1932  Subjective/Objective:  Domingo Dimesobert Jonesis a 83 y.o.malewith medical history significant for hypertension, hyperlipidemia, coronary artery disease with stent, COPD, chronic diastolic CHF, and atrial fibrillation on warfarin who presents to the emergency department following a syncopal episode at home.                  Action/Plan: Pt to dc home on Lovenox injections.  He states his daughter will be giving injections.  Pt has Medicare and Medicaid, and states Rx drugs covered by Medicaid.  Rx will need to written for brand-name Lovenox, as it is preferred on Medicaid formulary.    Expected Discharge Date:  04/08/16               Expected Discharge Plan:  Home/Self Care  In-House Referral:     Discharge planning Services  CM Consult, Medication Assistance  Post Acute Care Choice:    Choice offered to:     DME Arranged:    DME Agency:     HH Arranged:    HH Agency:     Status of Service:  Completed, signed off  If discussed at [employer] Length of Stay Meetings, dates discussed:    Additional Comments:  Glennon Macmerson, Stanford Strauch M, RN 04/08/2016, 4:55 PM

## 2016-04-08 NOTE — H&P (Addendum)
History and Physical    Lavel Rieman ONG:[id number] DOB: 12-20-1932 DOA: 04/07/2016  PCP: Patria Mane, MD   Patient coming from: Home  Chief Complaint: Syncope   HPI: Jose Lowery is a 83 y.o. male with medical history significant for hypertension, hyperlipidemia, coronary artery disease with stent, COPD, chronic diastolic CHF, and atrial fibrillation on warfarin who presents to the emergency department following a syncopal episode at home. Patient reports that he was in his usual state of health throughout the day and was sitting in a chair at home tonight when he developed some blurring in his vision, was having difficulty seeing the TV, and then became acutely lightheaded and apparently experienced a transient loss of consciousness. Family member was reportedly present and called EMS. Patient was found to be poorly responsive by EMS, reportedly hale and clammy with blood pressure of 80/50. He was given 500 mL of normal saline en route to the hospital. Patient denies any recent chest pain or palpitations and denies any swelling or tenderness in the lower extremity. There has been no significant change in his cough and his chronic dyspnea is largely unchanged as well. There has been no recent fevers or chills and no abdominal pain, vomiting, or diarrhea. Patient was discharged from the hospital approximately one month ago after treatment for influenza A with exacerbations in COPD and CHF. He was discharged home with new prescription for Lasix.   ED Course: Upon arrival to the ED, patient is found to be afebrile, saturating well on room air, and with vital signs otherwise stable. EKG features atrial fibrillation with PVC and LPFB. Chemistry panel is unremarkable and CBC is notable for a stable anemia with hemoglobin 10.5 and a stable thrombus cytopenia with platelets 129,000. Urinalysis suggests possible infection and sample was sent for culture. Chest x-ray is negative for acute cardiopulmonary  disease. Noncontrast head CT was obtained and negative for acute intracranial abnormality. Patient remained hemodynamically stable in the ED with stable blood pressure. He has not been in any respiratory distress while at rest. He will be observed on the telemetry unit for ongoing evaluation and management of syncopal episode, possibly vasovagal, but with considerable cardiac risk factors.  Review of Systems:  All other systems reviewed and apart from HPI, are negative.  Past Medical History:  Diagnosis Date  . COPD (chronic obstructive pulmonary disease) (HCC)   . Hyperlipidemia   . Hypertension   . Myocardial infarction   . Prosthetic eye globe    left    Past Surgical History:  Procedure Laterality Date  . CORONARY ANGIOPLASTY WITH STENT PLACEMENT       reports that he quit smoking about 5 weeks ago. His smoking use included Cigars and Cigarettes. He started smoking about 65 years ago. He has a 33.00 pack-year smoking history. He has quit using smokeless tobacco. His smokeless tobacco use included Chew. He reports that he drinks alcohol. He reports that he does not use drugs.  No Known Allergies  Family History  Problem Relation Age of Onset  . Lung disease Neg Hx   . Cancer Neg Hx      Prior to Admission medications   Medication Sig Start Date End Date Taking? Authorizing Provider  albuterol (PROVENTIL HFA;VENTOLIN HFA) 108 (90 Base) MCG/ACT inhaler Inhale 2 puffs into the lungs every 6 (six) hours as needed for wheezing or shortness of breath.    Historical Provider, MD  albuterol (PROVENTIL) (2.5 MG/3ML) 0.083% nebulizer solution Take 3 mLs (2.5 mg total) by  nebulization every 6 (six) hours as needed for wheezing or shortness of breath. 03/08/16   Leroy Sea, MD  amLODipine (NORVASC) 10 MG tablet Take 10 mg by mouth daily.    Historical Provider, MD  aspirin 81 MG chewable tablet Chew 81 mg by mouth daily.    Historical Provider, MD  atorvastatin (LIPITOR) 80 MG tablet  Take 80 mg by mouth daily.    Historical Provider, MD  carvedilol (COREG) 12.5 MG tablet Take 12.5 mg by mouth 2 (two) times daily with a meal.    Historical Provider, MD  furosemide (LASIX) 40 MG tablet Take 1 tablet (40 mg total) by mouth daily. 03/08/16   Leroy Sea, MD  ipratropium (ATROVENT) 0.02 % nebulizer solution Take 2.5 mLs (0.5 mg total) by nebulization 4 (four) times daily. 03/08/16   Leroy Sea, MD  losartan (COZAAR) 25 MG tablet Take 1 tablet (25 mg total) by mouth daily. 03/25/16   Kalman Shan, MD  magnesium oxide (MAG-OX) 400 (241.3 Mg) MG tablet Take 1 tablet (400 mg total) by mouth daily. 03/08/16   Leroy Sea, MD  nitroGLYCERIN (NITROSTAT) 0.4 MG SL tablet Place 0.4 mg under the tongue every 5 (five) minutes as needed for chest pain.    Historical Provider, MD  pantoprazole (PROTONIX) 20 MG tablet Take 20 mg by mouth 2 (two) times daily.    Historical Provider, MD  tamsulosin (FLOMAX) 0.4 MG CAPS capsule Take 0.4 mg by mouth.    Historical Provider, MD  ticagrelor (BRILINTA) 90 MG TABS tablet Take 90 mg by mouth 2 (two) times daily.    Historical Provider, MD  tiotropium (SPIRIVA) 18 MCG inhalation capsule Place 18 mcg into inhaler and inhale daily.    Historical Provider, MD  warfarin (COUMADIN) 2 MG tablet Take 2 mg by mouth daily. As directed by coumadin clinic    Historical Provider, MD    Physical Exam: Vitals:   04/07/16 2130 04/07/16 2200 04/07/16 2230 04/07/16 2300  BP: 104/63 104/69 111/68 105/73  Pulse: 66 78 77 72  Resp: 18 (!) 21 19 17   Temp:      TempSrc:      SpO2: 98% 98% 99% 99%  Weight:      Height:          Constitutional: NAD, calm, comfortable. Chronically ill in appearance.  Eyes: right pupil round and reactive to light; prosthetic left eye ENMT: Mucous membranes are moist. Posterior pharynx clear of any exudate or lesions.   Neck: normal, supple, no masses, no thyromegaly Respiratory: Wheezing throughout. Normal respiratory  effort. No accessory muscle use.  Cardiovascular: Rate ~80 and irregular, soft systolic murmur at upper sternal borders. No extremity edema. No significant JVD. Abdomen: No distension, no tenderness, no masses palpated. Bowel sounds normal.  Musculoskeletal: no clubbing / cyanosis. No joint deformity upper and lower extremities.  Skin: no significant rashes, lesions, ulcers. Warm, dry, well-perfused. Poor turgor.  Neurologic: CN 2-12 grossly intact. Sensation intact, DTR normal. Strength 5/5 in all 4 limbs.  Psychiatric: Normal judgment and insight. Alert and oriented x 3. Normal mood and affect.     Labs on Admission: I have personally reviewed following labs and imaging studies  CBC:  Recent Labs Lab 04/07/16 2055  WBC 4.5  HGB 10.5*  HCT 34.0*  MCV 82.1  PLT 129*   Basic Metabolic Panel:  Recent Labs Lab 04/07/16 2055  NA 139  K 4.2  CL 106  CO2 26  GLUCOSE 78  BUN 17  CREATININE 1.06  CALCIUM 8.0*   GFR: Estimated Creatinine Clearance: 65.3 mL/min (by C-G formula based on SCr of 1.06 mg/dL). Liver Function Tests: No results for input(s): AST, ALT, ALKPHOS, BILITOT, PROT, ALBUMIN in the last 168 hours. No results for input(s): LIPASE, AMYLASE in the last 168 hours. No results for input(s): AMMONIA in the last 168 hours. Coagulation Profile: No results for input(s): INR, PROTIME in the last 168 hours. Cardiac Enzymes: No results for input(s): CKTOTAL, CKMB, CKMBINDEX, TROPONINI in the last 168 hours. BNP (last 3 results) No results for input(s): PROBNP in the last 8760 hours. HbA1C: No results for input(s): HGBA1C in the last 72 hours. CBG: No results for input(s): GLUCAP in the last 168 hours. Lipid Profile: No results for input(s): CHOL, HDL, LDLCALC, TRIG, CHOLHDL, LDLDIRECT in the last 72 hours. Thyroid Function Tests: No results for input(s): TSH, T4TOTAL, FREET4, T3FREE, THYROIDAB in the last 72 hours. Anemia Panel: No results for input(s):  VITAMINB12, FOLATE, FERRITIN, TIBC, IRON, RETICCTPCT in the last 72 hours. Urine analysis:    Component Value Date/Time   COLORURINE YELLOW 04/07/2016 2230   APPEARANCEUR CLEAR 04/07/2016 2230   LABSPEC 1.008 04/07/2016 2230   PHURINE 5.0 04/07/2016 2230   GLUCOSEU NEGATIVE 04/07/2016 2230   HGBUR SMALL (A) 04/07/2016 2230   BILIRUBINUR NEGATIVE 04/07/2016 2230   KETONESUR NEGATIVE 04/07/2016 2230   PROTEINUR NEGATIVE 04/07/2016 2230   NITRITE POSITIVE (A) 04/07/2016 2230   LEUKOCYTESUR SMALL (A) 04/07/2016 2230   Sepsis Labs: @LABRCNTIP (procalcitonin:4,lacticidven:4) )No results found for this or any previous visit (from the past 240 hour(s)).   Radiological Exams on Admission: Dg Chest 2 View  Result Date: 04/07/2016 CLINICAL DATA:  Short of breath with exertion over the last month, former smoking history EXAM: CHEST  2 VIEW COMPARISON:  Chest x-ray of 03/08/2016 FINDINGS: The lungs remain hyperaerated. Mediastinal and hilar contours are unremarkable. The heart is within upper limits normal. No acute bony abnormality is seen. Mild thoracic aortic atherosclerosis is present. IMPRESSION: 1. No active cardiopulmonary disease.  Slight hyper aeration . 2. Mild thoracic aortic atherosclerosis Electronically Signed   By: Dwyane DeePaul  Barry M.D.   On: 04/07/2016 15:09   Ct Head Wo Contrast  Result Date: 04/07/2016 CLINICAL DATA:  83 y/o  M; syncope. EXAM: CT HEAD WITHOUT CONTRAST TECHNIQUE: Contiguous axial images were obtained from the base of the skull through the vertex without intravenous contrast. COMPARISON:  None. FINDINGS: Brain: No evidence of acute infarction, hemorrhage, hydrocephalus, extra-axial collection or mass lesion/mass effect. Few nonspecific foci of hypoattenuation in subcortical white matter are compatible with mild chronic microvascular ischemic changes. Small left posterior parietal chronic appearing cortical infarction. Vascular: Severe calcific atherosclerosis of cavernous and  paraclinoid internal carotid arteries. Skull: Normal. Negative for fracture or focal lesion. Sinuses/Orbits: Mild ethmoid and right maxillary sinus mucosal thickening. Normal aeration of mastoid air cells. 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 Other: None. IMPRESSION: 1. No acute intracranial abnormality identified. 2. Mild chronic microvascular ischemic changes of the brain 3. 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 4. Mild paranasal sinus disease. Electronically Signed   By: Mitzi HansenLance  Furusawa-Stratton M.D.   On: 04/07/2016 23:55    EKG: Independently reviewed. Atrial fibrillation, PVC, LPFB   Assessment/Plan  1. Syncope  - Pt presents following a syncopal episode that occurred while seated, immediately preceded by blurred vision and lightheadedness; denies CP, palpitations, focal numbness or weakness, leg swelling or tenderness, or hemoptysis - Noted  by EMS to be clammy, pale, and with BP 80/50 in the field; he was given 500 cc NS en route with normalization in BP  - Pt complains of slight headache on arrival, but otherwise feels well  - Head CT is negative for acute intracranial abnormality  - EKG with atrial fibrillation, chronic and rate-controlled  - Pt appears quite dry on admission and dehydration may have contributed, but event occurred while seated  - Prodrome suggests possible neurally-mediated reflex syncope, but pt has significant risk for cardiac etiology  - Plan to monitor on telemetry, check orthostatics, continue a gentle IVF hydration, check echo   2. COPD  - Pt has some wheezing on admission, but not in any distress  - Continue Atrovent and albuterol    3. Chronic atrial fibrillation  - In rate-controlled atrial fibrillation on admission  - CHADS-VASc is 110 (age x2, HTN, CHF, CVA x2, CAD)  - Continue warfarin, INR pending  - Continue beta-blocker    4. Chronic diastolic CHF  - Pt appears dehydrated on admission  - He  was noted to have diastolic dysfunction on recent TTE and was started on Lasix one month ago  - He was given 500 cc NS en route to hospital and will be continued on NS infusion  - Follow daily wts and I/O's, hold Lasix while hydrating, continue Coreg with holding parameters, hold losartan given hypotension with EMS pta  - Echo ordered as above   5. CAD - No anginal complaints  - Has hx of stent and managed with ASA 81, Brillinta, Lipitor, Coreg, and losartan  - Continue current management, but hold losartan initially given hypotension prior to arrival   6. Hypertension  - Pt was noted to be hypotensive with EMS prior to arrival  - Managed at home with losartan and Coreg - Losartan in held on admission and Coreg will be resumed in am with holding parameters    7. Anemia, thrombocytopenia  - Hgb 10.5 and platelets 129k on admission  - There is no evidence for bleeding and both indices appear stable relative to priors  - Monitor with periodic CBC    8. ?UTI  - UA suggestive of possible infection with positive nitrites, but only 0-5 WBC per hpf  - Has some vague systemic symptoms and hypotension of uncertain etiology pta  - Urine sent for culture and started on empiric Rocephin     DVT prophylaxis: warfarin Code Status: Full  Family Communication: Discussed with patient Disposition Plan: Observe on telemetry Consults called: None Admission status: Observation    Briscoe Deutscher, MD [facility] Pager [phone]  If 7PM-7AM, please contact night-coverage [email or web address] Password [email or web address]  04/08/2016, 12:16 AM

## 2016-04-08 NOTE — Progress Notes (Signed)
ANTICOAGULATION and ANTIBIOTIC CONSULT NOTE - Initial Consult  Pharmacy Consult for Coumadin and Rocephin Indication: Afib and UTI  No Known Allergies  Patient Measurements: Height: 6\' 3"  (190.5 cm) Weight: 190 lb (86.2 kg) IBW/kg (Calculated) : 84.5  Vital Signs: Temp: 97.9 F (36.6 C) (03/28 2050) Temp Source: Oral (03/28 2050) BP: 126/98 (03/29 0000) Pulse Rate: 89 (03/29 0000)  Labs:  Recent Labs  04/07/16 2055  HGB 10.5*  HCT 34.0*  PLT 129*  CREATININE 1.06    Estimated Creatinine Clearance: 65.3 mL/min (by C-G formula based on SCr of 1.06 mg/dL).   Medical History: Past Medical History:  Diagnosis Date  . COPD (chronic obstructive pulmonary disease) (HCC)   . Hyperlipidemia   . Hypertension   . Myocardial infarction   . Prosthetic eye globe    left    Assessment: 83yo male presents after syncopal event, found by EMS to be diaphoretic, pale, and hypotensive, to begin IV ABX for UTI and continue Coumadin for Afib.  Goal of Therapy:  INR 2-3   Plan:  Will obtain daily INR for Coumadin dosing.  Will begin Rocephin 1g IV Q24H.  Vernard GamblesVeronda Gilmore List, PharmD, BCPS  04/08/2016,12:22 AM

## 2016-04-09 ENCOUNTER — Other Ambulatory Visit: Payer: Self-pay | Admitting: Internal Medicine

## 2016-04-10 LAB — URINE CULTURE

## 2016-04-14 ENCOUNTER — Telehealth: Payer: Self-pay | Admitting: Pulmonary Disease

## 2016-04-14 MED ORDER — IPRATROPIUM BROMIDE 0.02 % IN SOLN: RESPIRATORY_TRACT | 0 refills | Status: DC

## 2016-04-14 MED ORDER — ALBUTEROL SULFATE (2.5 MG/3ML) 0.083% IN NEBU: INHALATION_SOLUTION | RESPIRATORY_TRACT | 0 refills | Status: AC

## 2016-04-14 NOTE — Telephone Encounter (Signed)
Re-sent the rxs with the dx code from pt's most recent visit with [facility]. Informed the pharmacist, they stated they will run this and if this does not qualify pt will need a PA. Gave her triage fax number

## 2016-04-16 ENCOUNTER — Telehealth: Payer: Self-pay | Admitting: Pulmonary Disease

## 2016-04-16 NOTE — Telephone Encounter (Signed)
Spoke with pharmacist at [facility], dx code changed to J44.9 to have meds covered by insurance.  Nothing further needed.

## 2016-04-25 ENCOUNTER — Encounter (HOSPITAL_BASED_OUTPATIENT_CLINIC_OR_DEPARTMENT_OTHER): Payer: Self-pay | Admitting: Emergency Medicine

## 2016-04-25 ENCOUNTER — Emergency Department (HOSPITAL_BASED_OUTPATIENT_CLINIC_OR_DEPARTMENT_OTHER)
Admission: EM | Admit: 2016-04-25 | Discharge: 2016-04-25 | Disposition: A | Discharge status: Home / Self Care | Payer: Medicare Other | Attending: Emergency Medicine | Admitting: Emergency Medicine

## 2016-04-25 ENCOUNTER — Emergency Department (HOSPITAL_BASED_OUTPATIENT_CLINIC_OR_DEPARTMENT_OTHER): Payer: Medicare Other

## 2016-04-25 DIAGNOSIS — I110 Hypertensive heart disease with heart failure: Secondary | ICD-10-CM | POA: Diagnosis not present

## 2016-04-25 DIAGNOSIS — Z87891 Personal history of nicotine dependence: Secondary | ICD-10-CM | POA: Insufficient documentation

## 2016-04-25 DIAGNOSIS — I2510 Atherosclerotic heart disease of native coronary artery without angina pectoris: Secondary | ICD-10-CM | POA: Diagnosis not present

## 2016-04-25 DIAGNOSIS — J449 Chronic obstructive pulmonary disease, unspecified: Secondary | ICD-10-CM | POA: Insufficient documentation

## 2016-04-25 DIAGNOSIS — I5033 Acute on chronic diastolic (congestive) heart failure: Secondary | ICD-10-CM | POA: Diagnosis not present

## 2016-04-25 DIAGNOSIS — Z7982 Long term (current) use of aspirin: Secondary | ICD-10-CM | POA: Diagnosis not present

## 2016-04-25 DIAGNOSIS — Z79899 Other long term (current) drug therapy: Secondary | ICD-10-CM | POA: Insufficient documentation

## 2016-04-25 DIAGNOSIS — Z7901 Long term (current) use of anticoagulants: Secondary | ICD-10-CM | POA: Insufficient documentation

## 2016-04-25 DIAGNOSIS — M7989 Other specified soft tissue disorders: Secondary | ICD-10-CM | POA: Diagnosis present

## 2016-04-25 LAB — COMPREHENSIVE METABOLIC PANEL
ALBUMIN: 3.1 g/dL — AB (ref 3.5–5.0)
ALT: 14 U/L — ABNORMAL LOW (ref 17–63)
AST: 22 U/L (ref 15–41)
Alkaline Phosphatase: 60 U/L (ref 38–126)
Anion gap: 7 (ref 5–15)
BUN: 9 mg/dL (ref 6–20)
CO2: 33 mmol/L — AB (ref 22–32)
CREATININE: 1.01 mg/dL (ref 0.61–1.24)
Calcium: 8.5 mg/dL — ABNORMAL LOW (ref 8.9–10.3)
Chloride: 102 mmol/L (ref 101–111)
GFR calc Af Amer: >60 mL/min (ref >60)
GFR calc non Af Amer: >60 mL/min (ref >60)
Glucose, Bld: 83 mg/dL (ref 65–99)
POTASSIUM: 3.7 mmol/L (ref 3.5–5.1)
SODIUM: 142 mmol/L (ref 135–145)
Total Bilirubin: 0.7 mg/dL (ref 0.3–1.2)
Total Protein: 6.6 g/dL (ref 6.5–8.1)

## 2016-04-25 LAB — CBC WITH DIFFERENTIAL/PLATELET
Basophils Absolute: 0 10*3/uL (ref 0.0–0.1)
Basophils Relative: 0 %
EOS PCT: 2 %
Eosinophils Absolute: 0.1 10*3/uL (ref 0.0–0.7)
HCT: 31 % — ABNORMAL LOW (ref 39.0–52.0)
Hemoglobin: 9.7 g/dL — ABNORMAL LOW (ref 13.0–17.0)
LYMPHS ABS: 1.6 10*3/uL (ref 0.7–4.0)
LYMPHS PCT: 28 %
MCH: 26 pg (ref 26.0–34.0)
MCHC: 31.3 g/dL (ref 30.0–36.0)
MCV: 83.1 fL (ref 78.0–100.0)
MONOS PCT: 20 %
Monocytes Absolute: 1.1 10*3/uL — ABNORMAL HIGH (ref 0.1–1.0)
NEUTROS ABS: 2.8 10*3/uL (ref 1.7–7.7)
Neutrophils Relative %: 50 %
PLATELETS: 206 10*3/uL (ref 150–400)
RBC: 3.73 MIL/uL — ABNORMAL LOW (ref 4.22–5.81)
RDW: 23.6 % — AB (ref 11.5–15.5)
WBC: 5.6 10*3/uL (ref 4.0–10.5)

## 2016-04-25 LAB — BRAIN NATRIURETIC PEPTIDE: B NATRIURETIC PEPTIDE 5: 900.5 pg/mL — AB (ref 0.0–100.0)

## 2016-04-25 MED ORDER — POTASSIUM CHLORIDE CRYS ER 20 MEQ PO TBCR
40.0000 meq | EXTENDED_RELEASE_TABLET | Freq: Once | ORAL | Status: AC
  Administered 2016-04-25: 40 meq via ORAL
  Filled 2016-04-25: qty 2

## 2016-04-25 MED ORDER — FUROSEMIDE 20 MG PO TABS: 20.0000 mg | ORAL_TABLET | Freq: Every day | ORAL | 0 refills | Status: AC

## 2016-04-25 MED ORDER — FUROSEMIDE 10 MG/ML IJ SOLN
40.0000 mg | Freq: Once | INTRAMUSCULAR | Status: AC
Start: 2016-04-25 — End: 2016-04-25
  Administered 2016-04-25: 40 mg via INTRAVENOUS
  Filled 2016-04-25: qty 4

## 2016-04-25 MED ORDER — IPRATROPIUM-ALBUTEROL 0.5-2.5 (3) MG/3ML IN SOLN
3.0000 mL | Freq: Once | RESPIRATORY_TRACT | Status: AC
  Administered 2016-04-25: 3 mL via RESPIRATORY_TRACT
  Filled 2016-04-25: qty 3

## 2016-04-25 NOTE — Discharge Instructions (Signed)
Call your family doc and see if they want to start you back on lasix.  Return for sob, chest pain

## 2016-04-25 NOTE — Progress Notes (Signed)
Pt ambulated out in hallway with his cane with no difficulties.  PT's O2 saturation remained 95-100% on RA with no SOB or weakness noted.  RT will continue to monitor.

## 2016-04-25 NOTE — ED Provider Notes (Signed)
[facility] Note   CSN: [id number] Arrival date & time: 04/25/16  1327     History   Chief Complaint Chief Complaint  Patient presents with  . Leg Swelling    HPI Jose Lowery is a 83 y.o. male.  83 yo M with a chief complaints of edema. Going on for at least the past couple days. Was recently admitted a couple weeks ago for syncope and was taken off his diuretics. Denies shortness of breath or chest pain.   The history is provided by the patient and a relative.  Illness  This is a new problem. The current episode started 2 days ago. The problem occurs constantly. The problem has not changed since onset.Pertinent negatives include no chest pain, no abdominal pain, no headaches and no shortness of breath. Nothing aggravates the symptoms. Nothing relieves the symptoms. He has tried nothing for the symptoms. The treatment provided no relief.    Past Medical History:  Diagnosis Date  . COPD (chronic obstructive pulmonary disease) (HCC)   . Hyperlipidemia   . Hypertension   . Myocardial infarction (HCC)   . Prosthetic eye globe    left    Patient Active Problem List   Diagnosis Date Noted  . CAD (coronary artery disease) 04/08/2016  . Chronic diastolic CHF (congestive heart failure) (HCC) 04/08/2016  . COPD (chronic obstructive pulmonary disease) (HCC) 04/07/2016  . Syncope 04/07/2016  . Normocytic anemia 04/07/2016  . UTI (urinary tract infection) 04/07/2016  . Prosthetic eye globe   . Essential hypertension 03/06/2016  . Chronic atrial fibrillation (HCC) 03/06/2016  . Research subject 03/06/2016    Past Surgical History:  Procedure Laterality Date  . CORONARY ANGIOPLASTY WITH STENT PLACEMENT         Home Medications    Prior to Admission medications   Medication Sig Start Date End Date Taking? Authorizing Provider  albuterol (PROVENTIL HFA;VENTOLIN HFA) 108 (90 Base) MCG/ACT inhaler Inhale 2 puffs into the lungs every 6 (six) hours as  needed for wheezing or shortness of breath.   Yes Historical Provider, MD  albuterol (PROVENTIL) (2.5 MG/3ML) 0.083% nebulizer solution USE 1 VIAL VIA NEBULIZER EVERY 6 HOURS AS NEEDED FOR WHEEZING OR SHORTNESS OF BREATH 04/14/16  Yes Roslynn Amble, MD  aspirin 81 MG chewable tablet Chew 81 mg by mouth daily.   Yes Historical Provider, MD  atorvastatin (LIPITOR) 80 MG tablet Take 80 mg by mouth every evening.    Yes Historical Provider, MD  carvedilol (COREG) 6.25 MG tablet Take 1 tablet (6.25 mg total) by mouth 2 (two) times daily with a meal. 04/08/16  Yes Joseph Art, DO  ipratropium (ATROVENT) 0.02 % nebulizer solution USE 1 VIAL VIA NEBULIZER FOUR TIMES DAILY 04/14/16  Yes Roslynn Amble, MD  MAGNESIUM-OXIDE 400 (241.3 Mg) MG tablet TAKE 1 TABLET BY MOUTH EVERY DAY 04/12/16  Yes Kalman Shan, MD  nitroGLYCERIN (NITROSTAT) 0.4 MG SL tablet Place 0.4 mg under the tongue every 5 (five) minutes as needed for chest pain.   Yes Historical Provider, MD  pantoprazole (PROTONIX) 20 MG tablet Take 40 mg by mouth daily.    Yes Historical Provider, MD  tamsulosin (FLOMAX) 0.4 MG CAPS capsule Take 0.4 mg by mouth every evening.    Yes Historical Provider, MD  tiotropium (SPIRIVA) 18 MCG inhalation capsule Place 18 mcg into inhaler and inhale daily.   Yes Historical Provider, MD  warfarin (COUMADIN) 2 MG tablet Take 2 mg by mouth daily.  Yes Historical Provider, MD  cefUROXime (CEFTIN) 250 MG tablet Take 1 tablet (250 mg total) by mouth 2 (two) times daily with a meal. 04/08/16   Joseph Art, DO  enoxaparin (LOVENOX) 120 MG/0.8ML injection Inject 0.8 mLs (120 mg total) into the skin daily. 04/08/16   Joseph Art, DO  furosemide (LASIX) 20 MG tablet Take 1 tablet (20 mg total) by mouth daily. 04/25/16   Melene Plan, DO    Family History Family History  Problem Relation Age of Onset  . Lung disease Neg Hx   . Cancer Neg Hx     Social History Social History  Substance Use Topics  . Smoking  status: Former Smoker    Packs/day: 0.50    Years: 66.00    Types: Cigars, Cigarettes    Start date: 11/18/1948    Quit date: 03/01/2016  . Smokeless tobacco: Former Neurosurgeon    Types: Chew     Comment: Smoked 3 small cigars daily / Cigarettes was 0.5ppd  . Alcohol use Yes     Comment: beer occasionally     Allergies   Patient has no known allergies.   Review of Systems Review of Systems  Constitutional: Negative for chills and fever.  HENT: Negative for congestion and facial swelling.   Eyes: Negative for discharge and visual disturbance.  Respiratory: Negative for shortness of breath.   Cardiovascular: Positive for leg swelling. Negative for chest pain and palpitations.  Gastrointestinal: Negative for abdominal pain, diarrhea and vomiting.  Musculoskeletal: Negative for arthralgias and myalgias.  Skin: Negative for color change and rash.  Neurological: Negative for tremors, syncope and headaches.  Psychiatric/Behavioral: Negative for confusion and dysphoric mood.     Physical Exam Updated Vital Signs BP (!) 172/110 (BP Location: Right Arm)   Pulse 95   Temp 98.4 F (36.9 C) (Oral)   Resp 18   Wt 181 lb (82.1 kg)   SpO2 100%   BMI 22.03 kg/m   Physical Exam  Constitutional: He is oriented to person, place, and time. He appears well-developed and well-nourished.  HENT:  Head: Normocephalic and atraumatic.  Eyes: EOM are normal. Pupils are equal, round, and reactive to light.  Neck: Normal range of motion. Neck supple. JVD (up to the angle of the jaw) present.  Cardiovascular: Normal rate and regular rhythm.  Exam reveals no gallop and no friction rub.   No murmur heard. Pulmonary/Chest: No respiratory distress. He has wheezes (diffuse expiratory, prolonged expiration).  Abdominal: He exhibits no distension. There is no rebound and no guarding.  Musculoskeletal: Normal range of motion. He exhibits edema (3+ up to the thighs).  Neurological: He is alert and oriented to  person, place, and time.  Skin: No rash noted. No pallor.  Psychiatric: He has a normal mood and affect. His behavior is normal.  Nursing note and vitals reviewed.    ED Treatments / Results  Labs (all labs ordered are listed, but only abnormal results are displayed) Labs Reviewed  CBC WITH DIFFERENTIAL/PLATELET - Abnormal; Notable for the following:       Result Value   RBC 3.73 (*)    Hemoglobin 9.7 (*)    HCT 31.0 (*)    RDW 23.6 (*)    Monocytes Absolute 1.1 (*)    All other components within normal limits  COMPREHENSIVE METABOLIC PANEL - Abnormal; Notable for the following:    CO2 33 (*)    Calcium 8.5 (*)    Albumin 3.1 (*)  ALT 14 (*)    All other components within normal limits  BRAIN NATRIURETIC PEPTIDE - Abnormal; Notable for the following:    B Natriuretic Peptide 900.5 (*)    All other components within normal limits    EKG  EKG Interpretation  Date/Time:   yo M With a chief complaint of edema. Patient was taken off his Lasix couple weeks ago. I suspect this is been slowly worsening. Not complaining of shortness of breath or chest pain. No rales noted on lung exam. Will attempt to ambulate ensure he is not short of breath. Check labs give Lasix if able.  K 3.7, given 40 lasix IV.  Will give short course of lasix.  PCP follow up in the next couple of days for definitive regimen.   3:04 PM:  I have discussed the diagnosis/risks/treatment options with the patient and family and believe the pt to be eligible for discharge home to follow-up with PCP. We also discussed returning to the ED immediately if new or worsening sx occur. We discussed the sx which are most concerning (e.g., sudden worsening pain, fever, inability to tolerate by mouth) that necessitate immediate return. Medications administered to the patient during their visit and any new prescriptions provided to the patient are listed below.  Medications given during this visit Medications  ipratropium-albuterol (DUONEB) 0.5-2.5 (3) MG/3ML nebulizer solution 3 mL (3 mLs Nebulization Given 04/25/16 1401)  furosemide (LASIX) injection 40 mg (40 mg Intravenous Given 04/25/16 1456)  potassium chloride SA (K-DUR,KLOR-CON) CR tablet 40 mEq (40 mEq Oral Given 04/25/16 1456)     The patient appears reasonably screen and/or stabilized for discharge and I doubt any other medical condition or other  [facility] requiring further screening, evaluation, or treatment in the ED at this time prior to discharge.    Final Clinical Impressions(s) / ED Diagnoses   Final diagnoses:  Acute on chronic diastolic congestive heart failure (HCC)    New Prescriptions New Prescriptions   FUROSEMIDE (LASIX) 20 MG TABLET  Take 1 tablet (20 mg total) by mouth daily.     Melene Plan, DO 04/25/16 1505

## 2016-04-25 NOTE — ED Triage Notes (Signed)
Swelling of feet and ankles and puffy under eyes.  Was taken off his lasix 3/29

## 2016-04-25 NOTE — ED Notes (Signed)
Dc instructions given, pt very HOH, dc instructions discussed with family and pt, family verbalized understanding. BP high during ED visit, per Dr. Adela Lank pt is ok to be dc home and follow up with PCP for hight BP.

## 2016-04-27 ENCOUNTER — Telehealth: Payer: Self-pay | Admitting: Internal Medicine

## 2016-04-27 DIAGNOSIS — J449 Chronic obstructive pulmonary disease, unspecified: Secondary | ICD-10-CM

## 2016-04-27 NOTE — Telephone Encounter (Signed)
A fax was received stating a PA is needed for the patient's ipratropium. After reviewing chart, it appeared [facility] was given diagnostic code to fill medication. [facility] [phone]) was contacted for follow up and stated the PA needed to be completed. [facility] [phone] was contacted for PA and stated everything looked normal, the physician just needs to fill out diagnostic code sheet. Awaiting fax diagnostic code sheet.   Will route to [place] to follow.

## 2016-04-29 MED ORDER — IPRATROPIUM BROMIDE 0.02 % IN SOLN: RESPIRATORY_TRACT | 5 refills | Status: AC

## 2016-04-30 ENCOUNTER — Telehealth: Payer: Self-pay | Admitting: Pulmonary Disease

## 2016-04-30 NOTE — Telephone Encounter (Signed)
A fax was sent to [facility] at [phone] for Ipratropium inhal soln 25 X and albuterol 0.083%. The order is for ipratropium quantity of 75ml and 1 refill with directions to take qid prn; albuterol 0.083% with quantity of 75ml with 1 refill and directions to take q6h prn. A confirmation fax was received. Nothing further is needed.

## 2016-05-10 ENCOUNTER — Other Ambulatory Visit: Payer: Self-pay | Admitting: Internal Medicine

## 2016-05-10 NOTE — Progress Notes (Signed)
Title: A Randomized, Double-Blind, Placebo-Controlled Dose Ranging Study Evaluating the Safety Pharmacokinetics and Clinical Benefit of FLU-IGIV in Hospitalized Patients with Serious Influenza A infection. IA-001 (ClinicalTrials.gov Identifier: [id number], Protocol No: IA-001, [facility] Protocol #[id number])  RESEARCH SUBJECT. This research study is sponsored by [employer]   The investigational product is called NP-025 aka FLU-IGIV or anti-influenza immune globulin intravenous. It is produced from source plasma collected from [place] Transport planner (FDA) licensed plasma collection establishments from healthy donors who have recovered from influenza (convalescent) and/or were vaccinated against seasonal influenza strains. The plasma contains a relatively high concentration of polyclonal antibodies directed against seasonal influenza strains, specifically influenza A strains H1N1 ([place]) and H3N2 ([place]). It is a glycoprotein of 150-160 kilodaltons against Hemagluttinin (HA) and Neuraminidase (NA) surface proteins.   Key Inclusion Criteria: Adult patients; locally determined positive influenza A infection (Rapid Antigen Test or PCR) from a specimen obtained within 2 days prior to randomization; onset of symptoms </= 6 days prior to randomization; experiencing >/= 1 respiratory symptom (cough, sore throat, nasal congestion) and >/= 1 constitutional symptom (headache, myalgia, feverishness or fatigue); NEW score >/=3 at screening; must have an active order for a minimum 5 day course of oseltamivir ('75mg'$ /twice daily).  Key Exclusion Criteria: History of hypersensitivity to blood or plasma products; history of allergy to latex or rubber; pregnancy or lactation; known IgA deficiency; medical conditions for which receipt of a 500 mL volume of IV fluid may be dangerous; a pre-existing condition or use of medication that may place the individual at a  substantially increased risk of thrombosis; anticipated life expectancy < 90 days; confirmed bacterial pneumonia or any concurrent respiratory viral infection that is not influenza A.  Key Randomization Information: Patients will be randomized to receive a single IV administration of FLU-IGIV (NP-025) or placebo (normal saline 0.9% NaCL). Two dose levels are being assessed, each a fixed dose by volume - 10 vials (450 mL) for the high dose and 5 vials (225 mL) for the low dose. Assuming a patient weight range of 60 kg to 100 kg, patients will receive approximately 0.32 - 0.53 g/kg of IgG protein for the high dose and approximately 0.16 - 0.26 g/kg for the low dose, in a single infusion.  Key other data: Side effects with infusion  Incidence Occurrence Side effect  Common 1-15%, typically < 5% First 30 minutes of infusion Back pain, abdominal pain, nausea, vomiting  Common 1-15%, typically < 5% First few minutes to several hours after infusion Chills, fever, headache, myalgia, fatigue  Rare Seconds to several hours after infusion Hypersensitivity reaction (happens with IgA deficiency), flushing, facial swelling, dyspnea, cyanosis, shock, cardiac arrest, pneumonitis, renal failure, aseptic meningitis, hemolysis, viral infection  Rare Seconds to several hours after infusion Thrombosis (at risk patients are those with a history of DVT, arterial thrombosis, multiple cardiovascular risk factors, impaired cardiac output, advanced age, coagulation disorders, prolonged immobilization, use of estrogen, indwelling catheters, known/suspected hyperviscosity)  Recommendation: For patients who are at risk of developing thrombotic events, administer NP-025 at the minimum rate of infusion practicable, not to exceed 2 mL/min. Ensure adequate hydration in patients before administration. Monitor  Rare 1-6 hours after infusion Transfusion related Acute Lung Injury (TRALI)  Rare  Hemolysis (dose related, happens in total >  2g/kg and non-O blood group), severe hemolysis may lead to renal dysfunction/failure.  Recommendation: Check hemoglobin pre- and post 36-96h and 7-10 days transfusion  Rare Several hours to two  days  after infusion Aseptic Meningitis (AMS) (dose related, happens in total > 2g/kg)   NOTE: NP-025 contains 10% maltose - will interfere with glucose measurements if non-glucose specific measurement devices are not used. Results in false high glucose levels can  result in increased insulin -> life threatening hypoglycemia  ..................................................................................................Marland Kitchen  LATE ENTRY Clinical Research Coordinator / Research RN note : This visit for Subject Jose Lowery with DOB: July 01, 1932 on 05/06/2016 at 11:40am for the above protocol is Visit/Encounter # Day 60  and is for purpose of research .   Subject thanked for participation in research and contribution to science.   Subject presented to the outpatient office (not currently admitted to the ICU) for Day 60 visit. Subject stated that he feels he is back to normal activities. Subject and son-in-law states that his edema has resolved as of 05/06/2016. Subject also denies difficulty or pain with urination, denies any dizziness or confusion.  Per PI SAE of sepsis due to urinary tract infection (urosepsis) has resolved as of date of completion of prescribed antibiotics.   Signed by Carron Curie, CMA Clinical Research Coordinator  [facility]  [place], [place] 3:46 PM 05/10/2016

## 2016-05-28 ENCOUNTER — Ambulatory Visit (INDEPENDENT_AMBULATORY_CARE_PROVIDER_SITE_OTHER): Payer: Medicare Other | Admitting: *Deleted

## 2016-05-28 ENCOUNTER — Ambulatory Visit (INDEPENDENT_AMBULATORY_CARE_PROVIDER_SITE_OTHER): Payer: Medicare Other | Admitting: Pulmonary Disease

## 2016-05-28 DIAGNOSIS — R0609 Other forms of dyspnea: Secondary | ICD-10-CM

## 2016-05-28 NOTE — Progress Notes (Signed)
SIX MIN WALK 05/28/2016  Medications Pt is unsure of what medications he took. Pt states his daugther gets his medication ready for him and she did not come with him during his appt  Supplimental Oxygen during Test? (L/min) No  Laps 7  Partial Lap (in Meters) 11  Baseline BP (sitting) 156/84  Baseline Heartrate 72  Baseline Dyspnea (Borg Scale) 0  Baseline Fatigue (Borg Scale) 0  Baseline SPO2 99  BP (sitting) 150/80  Heartrate 116  Dyspnea (Borg Scale) 3  Fatigue (Borg Scale) 0  SPO2 99  BP (sitting) 152/82  Heartrate 75  SPO2 99  Stopped or Paused before Six Minutes No  Distance Completed 347  Tech Comments: Pt walked a slow steady pace with his cane. Pt was able to complete his walk. He did not complain of any pain or discomfort during or after walk. Pt did well on his walk, did have slight sob

## 2016-06-07 ENCOUNTER — Other Ambulatory Visit: Payer: Self-pay | Admitting: Internal Medicine

## 2016-06-10 NOTE — Telephone Encounter (Signed)
MR please advise if ok to refill  this medication for the pt.  thanks

## 2016-06-11 ENCOUNTER — Ambulatory Visit: Payer: Medicare Other | Admitting: Pulmonary Disease

## 2016-06-11 NOTE — Progress Notes (Deleted)
Subjective:    Patient ID: Jose Lowery, male    DOB: 10/09/1932, 83 y.o.   MRN: [id number]  C.C.:  Follow-up for Dyspnea on Exertion, Influenza A Pneumonia, & Tobacco Use Disorder.  HPI  Dyspnea on exertion: Likely multifactorial. Patient unable to perform spirometry. Previously prescribed an albuterol rescue inhaler and was using 1-2 times daily with some relief of dyspnea. Patient was also started on Spiriva in February as well as a prednisone taper.  Influenza A pneumonia: No evidence of residual opacity on x-ray imaging in April.  Tobacco use disorder: Patient previously quit smoking tobacco prior to his hospitalization for influenza A and February of this year.  Review of Systems   No Known Allergies  Current Outpatient Prescriptions on File Prior to Visit  Medication Sig Dispense Refill  . albuterol (PROVENTIL HFA;VENTOLIN HFA) 108 (90 Base) MCG/ACT inhaler Inhale 2 puffs into the lungs every 6 (six) hours as needed for wheezing or shortness of breath.    Marland Kitchen. albuterol (PROVENTIL) (2.5 MG/3ML) 0.083% nebulizer solution USE 1 VIAL VIA NEBULIZER EVERY 6 HOURS AS NEEDED FOR WHEEZING OR SHORTNESS OF BREATH 75 mL 0  . aspirin 81 MG chewable tablet Chew 81 mg by mouth daily.    Marland Kitchen. atorvastatin (LIPITOR) 80 MG tablet Take 80 mg by mouth every evening.     . carvedilol (COREG) 6.25 MG tablet Take 1 tablet (6.25 mg total) by mouth 2 (two) times daily with a meal. 60 tablet 0  . cefUROXime (CEFTIN) 250 MG tablet Take 1 tablet (250 mg total) by mouth 2 (two) times daily with a meal. 14 tablet 0  . enoxaparin (LOVENOX) 120 MG/0.8ML injection Inject 0.8 mLs (120 mg total) into the skin daily. 5 Syringe 0  . furosemide (LASIX) 20 MG tablet Take 1 tablet (20 mg total) by mouth daily. 5 tablet 0  . ipratropium (ATROVENT) 0.02 % nebulizer solution USE 1 VIAL VIA NEBULIZER FOUR TIMES DAILY 300 mL 5  . MAGNESIUM-OXIDE 400 (241.3 Mg) MG tablet TAKE 1 TABLET BY MOUTH EVERY DAY 30 tablet 0  .  nitroGLYCERIN (NITROSTAT) 0.4 MG SL tablet Place 0.4 mg under the tongue every 5 (five) minutes as needed for chest pain.    . pantoprazole (PROTONIX) 20 MG tablet Take 40 mg by mouth daily.     . tamsulosin (FLOMAX) 0.4 MG CAPS capsule Take 0.4 mg by mouth every evening.     . tiotropium (SPIRIVA) 18 MCG inhalation capsule Place 18 mcg into inhaler and inhale daily.    Marland Kitchen. warfarin (COUMADIN) 2 MG tablet Take 2 mg by mouth daily.      No current facility-administered medications on file prior to visit.     Past Medical History:  Diagnosis Date  . COPD (chronic obstructive pulmonary disease) (HCC)   . Hyperlipidemia   . Hypertension   . Myocardial infarction (HCC)   . Prosthetic eye globe    left    Past Surgical History:  Procedure Laterality Date  . CORONARY ANGIOPLASTY WITH STENT PLACEMENT      Family History  Problem Relation Age of Onset  . Lung disease Neg Hx   . Cancer Neg Hx     Social History   Social History  . Marital status: Single    Spouse name: N/A  . Number of children: N/A  . Years of education: N/A   Social History Main Topics  . Smoking status: Former Smoker    Packs/day: 0.50    Years: 66.00  Types: Cigars, Cigarettes    Start date: 11/18/1950    Quit date: 03/01/2016  . Smokeless tobacco: Former Neurosurgeon    Types: Chew     Comment: Smoked 3 small cigars daily / Cigarettes was 0.5ppd  . Alcohol use Yes     Comment: beer occasionally  . Drug use: No  . Sexual activity: Not on file   Other Topics Concern  . Not on file   Social History Narrative   [facility] (04/07/16):   Originally from [place]. Previously also lived in [place]. Moved to live with his son-in-law & daughter in 2008. 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 No mold or bird exposure.       Objective:   Physical Exam There were no vitals taken for this visit.  General:  Awake. Alert. No acute distress. Sitting watching TV. Family at bedside.  Integument:  Warm & dry. No  rash on exposed skin. No bruising. Extremities:  No cyanosis or clubbing.  HEENT:  Moist mucus membranes. No oral ulcers. No scleral injection or icterus. Endotracheal tube in place. PERRL. Cardiovascular:  Regular rate. No edema. No appreciable JVD.  Pulmonary:  Good aeration & clear to auscultation bilaterally. Symmetric chest wall expansion. No accessory muscle use. Abdomen: Soft. Normal bowel sounds. Nondistended. Grossly nontender. Musculoskeletal:  Normal bulk and tone. Hand grip strength 5/5 bilaterally. No joint deformity or effusion appreciated.  General:  Awake. Alert. No acute distress. Accompanied by his son-in-law today. Integument:  Warm & dry. No rash on exposed skin. No bruising. Extremities:  No cyanosis or clubbing.  Lymphatics:  No appreciated cervical or supraclavicular lymphadenoapthy. HEENT:  Moist mucus membranes. No oral ulcers. No scleral injection or icterus.  Cardiovascular:  Regular rate. No edema. Mild systolic ejection murmur appreciated at the aortic position.  Pulmonary:  Patchy mild bilateral wheezing. Otherwise good aeration. Symmetric chest wall expansion. No accessory muscle use. Abdomen: Soft. Normal bowel sounds. Nondistended. Grossly nontender. Musculoskeletal:  Normal bulk and tone. Hand grip strength 5/5 bilaterally. No joint deformity or effusion appreciated. Neurological:  CN 2-12 grossly in tact. No meningismus. Moving all 4 extremities equally. Psychiatric:  Mood and affect congruent. Speech normal rhythm, rate & tone.   IMAGING CXR PA/LAT 04/25/16 (personally reviewed by me): No focal opacity appreciated. Pulmonary vascular congestion is present. Borderline cardiomegaly. Trace to small bilateral pleural effusions.  PORT CXR 03/08/16 (previously reviewed by me): Increased interstitial prominence within right mid and lower lung zones. No other focal opacification appreciated. No pleural effusion. Suggestion of cardiomegaly.  CARDIAC TTE (03/06/16):  Moderate concentric LVH. EF 60-65% with suggestion of diastolic dysfunction. LA & RA moderately dilated. Mild aortic regurgitation with very mild aortic stenosis. Mild dilatation of aortic root. Trivial mitral regurgitation without stenosis. Mild tricuspid regurgitation. No pericardial effusion.    Assessment & Plan:  83 y.o. male with dyspnea on exertion as well as tobacco use disorder. hospitalized in February for influenza pneumonia. Patient's dyspnea on exertion could certainly be due to COPD given his long-standing prior history of tobacco use. We will need to assess for possible residual opacity with x-ray imaging today. He will require pulmonary function testing to better assess his lung function as well. I congratulated patient on having quit smoking and encouraged him to continue to abstain. He will contact me if he has any new breathing problems or questions before his next appointment.  1. Dyspnea on exertion: 2. Tobacco use disorder: 3. Health maintenance: 4. Follow-up: Return to clinic in  Donna Christen Jamison Neighbor, M.D. [facility] Pager:  [phone] After 3pm or if no response, call [phone] 7:48 AM 06/11/16

## 2016-06-16 ENCOUNTER — Other Ambulatory Visit: Payer: Self-pay | Admitting: Internal Medicine

## 2016-06-24 ENCOUNTER — Other Ambulatory Visit: Payer: Self-pay | Admitting: Emergency Medicine

## 2016-07-05 NOTE — Addendum Note (Signed)
Addended by: Reynaldo MiniumWELCHEL, Jimmye Wisnieski C on: 07/05/2016 03:17 PM   Modules accepted: Level of Service

## 2016-07-05 NOTE — Progress Notes (Signed)
Pt unable to complete test.  

## 2017-07-08 ENCOUNTER — Emergency Department (HOSPITAL_BASED_OUTPATIENT_CLINIC_OR_DEPARTMENT_OTHER): Payer: Medicare Other

## 2017-07-08 ENCOUNTER — Emergency Department (HOSPITAL_BASED_OUTPATIENT_CLINIC_OR_DEPARTMENT_OTHER)
Admission: EM | Admit: 2017-07-08 | Discharge: 2017-07-08 | Disposition: A | Discharge status: Home / Self Care | Payer: Medicare Other | Attending: Emergency Medicine | Admitting: Emergency Medicine

## 2017-07-08 ENCOUNTER — Other Ambulatory Visit: Payer: Self-pay

## 2017-07-08 ENCOUNTER — Encounter (HOSPITAL_BASED_OUTPATIENT_CLINIC_OR_DEPARTMENT_OTHER): Payer: Self-pay | Admitting: Emergency Medicine

## 2017-07-08 DIAGNOSIS — Z79899 Other long term (current) drug therapy: Secondary | ICD-10-CM | POA: Insufficient documentation

## 2017-07-08 DIAGNOSIS — I2510 Atherosclerotic heart disease of native coronary artery without angina pectoris: Secondary | ICD-10-CM | POA: Insufficient documentation

## 2017-07-08 DIAGNOSIS — Z7901 Long term (current) use of anticoagulants: Secondary | ICD-10-CM | POA: Insufficient documentation

## 2017-07-08 DIAGNOSIS — J449 Chronic obstructive pulmonary disease, unspecified: Secondary | ICD-10-CM | POA: Diagnosis not present

## 2017-07-08 DIAGNOSIS — I482 Chronic atrial fibrillation: Secondary | ICD-10-CM | POA: Diagnosis not present

## 2017-07-08 DIAGNOSIS — A5909 Other urogenital trichomoniasis: Secondary | ICD-10-CM | POA: Diagnosis not present

## 2017-07-08 DIAGNOSIS — I5032 Chronic diastolic (congestive) heart failure: Secondary | ICD-10-CM | POA: Diagnosis not present

## 2017-07-08 DIAGNOSIS — I951 Orthostatic hypotension: Secondary | ICD-10-CM | POA: Diagnosis not present

## 2017-07-08 DIAGNOSIS — Z7982 Long term (current) use of aspirin: Secondary | ICD-10-CM | POA: Insufficient documentation

## 2017-07-08 DIAGNOSIS — I252 Old myocardial infarction: Secondary | ICD-10-CM | POA: Insufficient documentation

## 2017-07-08 DIAGNOSIS — A5903 Trichomonal cystitis and urethritis: Secondary | ICD-10-CM

## 2017-07-08 DIAGNOSIS — I110 Hypertensive heart disease with heart failure: Secondary | ICD-10-CM | POA: Insufficient documentation

## 2017-07-08 DIAGNOSIS — R42 Dizziness and giddiness: Secondary | ICD-10-CM | POA: Insufficient documentation

## 2017-07-08 DIAGNOSIS — F17290 Nicotine dependence, other tobacco product, uncomplicated: Secondary | ICD-10-CM | POA: Diagnosis not present

## 2017-07-08 DIAGNOSIS — E162 Hypoglycemia, unspecified: Secondary | ICD-10-CM | POA: Diagnosis not present

## 2017-07-08 LAB — CBG MONITORING, ED
GLUCOSE-CAPILLARY: 45 mg/dL — AB (ref 70–99)
GLUCOSE-CAPILLARY: 147 mg/dL — AB (ref 70–99)
Glucose-Capillary: 77 mg/dL (ref 70–99)

## 2017-07-08 LAB — CBC
HCT: 40.3 % (ref 39.0–52.0)
HEMOGLOBIN: 13.5 g/dL (ref 13.0–17.0)
MCH: 27.8 pg (ref 26.0–34.0)
MCHC: 33.5 g/dL (ref 30.0–36.0)
MCV: 82.9 fL (ref 78.0–100.0)
Platelets: 151 10*3/uL (ref 150–400)
RBC: 4.86 MIL/uL (ref 4.22–5.81)
RDW: 22.4 % — ABNORMAL HIGH (ref 11.5–15.5)
WBC: 8 10*3/uL (ref 4.0–10.5)

## 2017-07-08 LAB — BASIC METABOLIC PANEL
ANION GAP: 10 (ref 5–15)
BUN: 12 mg/dL (ref 8–23)
CALCIUM: 8.4 mg/dL — AB (ref 8.9–10.3)
CO2: 25 mmol/L (ref 22–32)
CREATININE: 1.06 mg/dL (ref 0.61–1.24)
Chloride: 105 mmol/L (ref 98–111)
GFR calc Af Amer: >60 mL/min (ref >60)
Glucose, Bld: 98 mg/dL (ref 70–99)
Potassium: 4.5 mmol/L (ref 3.5–5.1)
SODIUM: 140 mmol/L (ref 135–145)

## 2017-07-08 LAB — URINALYSIS, ROUTINE W REFLEX MICROSCOPIC
BILIRUBIN URINE: NEGATIVE
Glucose, UA: NEGATIVE mg/dL
Ketones, ur: NEGATIVE mg/dL
NITRITE: POSITIVE — AB
PROTEIN: NEGATIVE mg/dL
SPECIFIC GRAVITY, URINE: 1.01 (ref 1.005–1.030)
pH: 5 (ref 5.0–8.0)

## 2017-07-08 LAB — URINALYSIS, MICROSCOPIC (REFLEX)

## 2017-07-08 MED ORDER — SODIUM CHLORIDE 0.9 % IV BOLUS
500.0000 mL | Freq: Once | INTRAVENOUS | Status: AC
  Administered 2017-07-08: 500 mL via INTRAVENOUS

## 2017-07-08 MED ORDER — IPRATROPIUM-ALBUTEROL 0.5-2.5 (3) MG/3ML IN SOLN
3.0000 mL | Freq: Four times a day (QID) | RESPIRATORY_TRACT | Status: DC
  Administered 2017-07-08: 3 mL via RESPIRATORY_TRACT
  Filled 2017-07-08: qty 3

## 2017-07-08 MED ORDER — METRONIDAZOLE 500 MG PO TABS
2000.0000 mg | ORAL_TABLET | Freq: Once | ORAL | Status: AC
Start: 2017-07-08 — End: 2017-07-08
  Administered 2017-07-08: 2000 mg via ORAL
  Filled 2017-07-08: qty 4

## 2017-07-08 NOTE — ED Notes (Signed)
Pt on monitor 

## 2017-07-08 NOTE — ED Notes (Signed)
ED Provider at bedside. 

## 2017-07-08 NOTE — ED Provider Notes (Signed)
[facility] EMERGENCY DEPARTMENT Provider Note   CSN: [id number] Arrival date & time: 07/08/17  1552     History   Chief Complaint Chief Complaint  Patient presents with  . Dizziness    HPI Jose Lowery is a 84 y.o. male who is on long-term coagulation with Coumadin, presents to the emergency department today after a fall last night.  Patient states that he became lightheaded, his vision darkened and then he fell to the ground.  Patient states that he does not think he lost consciousness, denies hitting his head but states that he had a headache after his fall.  Patient denies headache at this time.  Patient denies neck pain.  Patient states that he was able to lift himself off the floor and went to bed.  Patient denies all pain at this time. Patient states that he has been feeling dizzy intermittently over the past week but has not fallen until last night.  Patient also endorses right hand pain, patient states that after his fall his night his right first finger swollen and states that he has throbbing moderate pain.  Patient has not taken anything for his pain.  Patient's chart reviewed, he was admitted on 04/07/2016 for syncope however the note says that the patient did not syncopized and that he was dehydrated.  Patient also has history of COPD, UTI, chronic A. fib, hypertension anemia anticoagulated with Coumadin.  Patient is a current everyday smoker.  Dizziness  Quality:  Lightheadedness and imbalance Severity:  Moderate Onset quality:  Sudden Chronicity:  New Relieved by:  Nothing Worsened by:  Nothing Ineffective treatments:  None tried Associated symptoms: headaches and syncope   Associated symptoms: no blood in stool, no chest pain, no diarrhea, no nausea, no palpitations, no shortness of breath, no vision changes, no vomiting and no weakness   Risk factors: heart disease     Past Medical History:  Diagnosis Date  . COPD (chronic obstructive pulmonary  disease) (HCC)   . Hyperlipidemia   . Hypertension   . Myocardial infarction (HCC)   . Prosthetic eye globe    left    Patient Active Problem List   Diagnosis Date Noted  . CAD (coronary artery disease) 04/08/2016  . Chronic diastolic CHF (congestive heart failure) (HCC) 04/08/2016  . COPD (chronic obstructive pulmonary disease) (HCC) 04/07/2016  . Syncope 04/07/2016  . Normocytic anemia 04/07/2016  . UTI (urinary tract infection) 04/07/2016  . Prosthetic eye globe   . Essential hypertension 03/06/2016  . Chronic atrial fibrillation (HCC) 03/06/2016  . Research subject 03/06/2016    Past Surgical History:  Procedure Laterality Date  . CORONARY ANGIOPLASTY WITH STENT PLACEMENT          Home Medications    Prior to Admission medications   Medication Sig Start Date End Date Taking? Authorizing Provider  albuterol (PROVENTIL HFA;VENTOLIN HFA) 108 (90 Base) MCG/ACT inhaler Inhale 2 puffs into the lungs every 6 (six) hours as needed for wheezing or shortness of breath.    [provider]  albuterol (PROVENTIL) (2.5 MG/3ML) 0.083% nebulizer solution USE 1 VIAL VIA NEBULIZER EVERY 6 HOURS AS NEEDED FOR WHEEZING OR SHORTNESS OF BREATH 04/14/16   Roslynn Amble, MD  aspirin 81 MG chewable tablet Chew 81 mg by mouth daily.    [provider]  atorvastatin (LIPITOR) 80 MG tablet Take 80 mg by mouth every evening.     [provider]  carvedilol (COREG) 6.25 MG tablet Take 1 tablet (6.25  mg total) by mouth 2 (two) times daily with a meal. 04/08/16   Marlin Canary U, DO  cefUROXime (CEFTIN) 250 MG tablet Take 1 tablet (250 mg total) by mouth 2 (two) times daily with a meal. 04/08/16   Marlin Canary U, DO  enoxaparin (LOVENOX) 120 MG/0.8ML injection Inject 0.8 mLs (120 mg total) into the skin daily. 04/08/16   Joseph Art, DO  furosemide (LASIX) 20 MG tablet Take 1 tablet (20 mg total) by mouth daily. 04/25/16   Melene Plan, DO  ipratropium (ATROVENT) 0.02 %  nebulizer solution USE 1 VIAL VIA NEBULIZER FOUR TIMES DAILY 04/29/16   Kalman Shan, MD  MAGNESIUM-OXIDE 400 (241.3 Mg) MG tablet TAKE 1 TABLET BY MOUTH EVERY DAY 05/11/16   Kalman Shan, MD  nitroGLYCERIN (NITROSTAT) 0.4 MG SL tablet Place 0.4 mg under the tongue every 5 (five) minutes as needed for chest pain.    [provider]  pantoprazole (PROTONIX) 20 MG tablet Take 40 mg by mouth daily.     [provider]  tamsulosin (FLOMAX) 0.4 MG CAPS capsule Take 0.4 mg by mouth every evening.     [provider]  tiotropium (SPIRIVA) 18 MCG inhalation capsule Place 18 mcg into inhaler and inhale daily.    [provider]  warfarin (COUMADIN) 2 MG tablet Take 2 mg by mouth daily.     [provider]    Family History Family History  Problem Relation Age of Onset  . Lung disease Neg Hx   . Cancer Neg Hx     Social History Social History   Tobacco Use  . Smoking status: Current Every Day Smoker    Packs/day: 0.50    Years: 66.00    Pack years: 33.00    Types: Cigars, Cigarettes    Start date: 11/18/1948    Last attempt to quit: 03/01/2016    Years since quitting: 1.3  . Smokeless tobacco: Former Neurosurgeon    Types: Chew  . Tobacco comment: Smoked 3 small cigars daily / Cigarettes was 0.5ppd  Substance Use Topics  . Alcohol use: Yes    Comment: beer occasionally  . Drug use: No     Allergies   Patient has no known allergies.   Review of Systems Review of Systems  Constitutional: Negative.  Negative for chills, fatigue and fever.  HENT: Negative.  Negative for congestion, ear pain, rhinorrhea, sore throat and trouble swallowing.   Eyes: Negative.  Negative for visual disturbance.  Respiratory: Negative.  Negative for cough, chest tightness and shortness of breath.   Cardiovascular: Positive for syncope. Negative for chest pain, palpitations and leg swelling.  Gastrointestinal: Negative.  Negative for abdominal pain, blood in  stool, diarrhea, nausea and vomiting.  Genitourinary: Negative.  Negative for dysuria, flank pain and hematuria.  Musculoskeletal: Positive for joint swelling. Negative for arthralgias, myalgias and neck pain.  Skin: Negative.  Negative for rash.  Neurological: Positive for dizziness and headaches. Negative for syncope, weakness and light-headedness.     Physical Exam Updated Vital Signs BP 115/73   Pulse 81   Temp (!) 97 F (36.1 C) (Oral)   Resp (!) 27   SpO2 98%   Physical Exam  Constitutional: He is oriented to person, place, and time. He appears well-developed and well-nourished. No distress.  HENT:  Head: Normocephalic and atraumatic. Head is without raccoon's eyes and without Battle's sign.    Right Ear: Tympanic membrane, external ear and ear canal normal. No hemotympanum.  Left  Ear: Tympanic membrane, external ear and ear canal normal. No hemotympanum.  Nose: Nose normal.  Mouth/Throat: Uvula is midline and oropharynx is clear and moist.  Eyes: Conjunctivae are normal. Right eye exhibits normal extraocular motion and no nystagmus. Right pupil is round and reactive.    Neck: Normal range of motion. Neck supple. No tracheal deviation present.  Cardiovascular: Normal rate and regular rhythm.  Pulmonary/Chest: Effort normal. No accessory muscle usage. No respiratory distress. He has wheezes in the right upper field, the right middle field, the right lower field, the left upper field, the left middle field and the left lower field. He has rhonchi in the right upper field, the right middle field, the right lower field, the left upper field, the left middle field and the left lower field.  Abdominal: Soft. Bowel sounds are normal. There is no tenderness. There is no rebound and no guarding.  Genitourinary:  Genitourinary Comments: Deferred by patient  Musculoskeletal:       Hands: Second MCP joint tenderness, decreased range of motion.  Swelling to the right second MCP joint,  decreased range of motion of the right index finger due to pain. There is a slight warmth to the skin around the second MCP joint however there is no sign of erythema, streaking, fluctuance, or induration. No signs of skin breakdown/injury.  Cap refill intact to all fingers.  Sensation intact to all fingers.  Neurological: He is alert and oriented to person, place, and time. He has normal strength. No cranial nerve deficit or sensory deficit. He displays a negative Romberg sign. Coordination normal.  Skin: Skin is dry. Capillary refill takes less than 2 seconds.  Psychiatric: He has a normal mood and affect. His behavior is normal.     ED Treatments / Results  Labs (all labs ordered are listed, but only abnormal results are displayed) Labs Reviewed  BASIC METABOLIC PANEL - Abnormal; Notable for the following components:      Result Value   Calcium 8.4 (*)    All other components within normal limits  CBC - Abnormal; Notable for the following components:   RDW 22.4 (*)    All other components within normal limits  URINALYSIS, ROUTINE W REFLEX MICROSCOPIC - Abnormal; Notable for the following components:   APPearance HAZY (*)    Hgb urine dipstick MODERATE (*)    Nitrite POSITIVE (*)    Leukocytes, UA SMALL (*)    All other components within normal limits  URINALYSIS, MICROSCOPIC (REFLEX) - Abnormal; Notable for the following components:   Bacteria, UA MANY (*)    Trichomonas, UA PRESENT (*)    All other components within normal limits  CBG MONITORING, ED - Abnormal; Notable for the following components:   Glucose-Capillary 45 (*)    All other components within normal limits  CBG MONITORING, ED - Abnormal; Notable for the following components:   Glucose-Capillary 147 (*)    All other components within normal limits  CBG MONITORING, ED    EKG EKG Interpretation  Date/Time:  Friday July 08 2017 16:54:26 EDT Ventricular Rate:  95 PR Interval:    QRS Duration: 100 QT  Interval:  386 QTC Calculation: 486 R Axis:   -69 Text Interpretation:  Atrial fibrillation Left anterior fascicular block Anterior infarct, old No significant change since last tracing Confirmed by Gwyneth SproutPlunkett, Whitney ([id number]) on 07/08/2017 5:21:45 PM   Radiology Dg Chest 2 View  Result Date: 07/08/2017 CLINICAL DATA:  Smoker, status post fall EXAM: CHEST - 2  VIEW COMPARISON:  04/25/2016 FINDINGS: The lungs are hyperinflated likely secondary to COPD. There is no focal parenchymal opacity. There is no pleural effusion or pneumothorax. The heart and mediastinal contours are unremarkable. The osseous structures are unremarkable. IMPRESSION: No active cardiopulmonary disease. Electronically Signed   By: Elige Ko   On: 07/08/2017 16:59   Ct Head Wo Contrast  Result Date: 07/08/2017 CLINICAL DATA:  Initial evaluation for acute headache, dizziness, syncope. EXAM: CT HEAD WITHOUT CONTRAST TECHNIQUE: Contiguous axial images were obtained from the base of the skull through the vertex without intravenous contrast. COMPARISON:  Prior CT from 04/07/2017. FINDINGS: Brain: Generalized age-related cerebral atrophy with mild chronic small vessel ischemic disease. Remote left occipital lobe infarct. Additional remote right PCA territory infarct involving the parasagittal right temporal and occipital lobes, chronic in appearance, but new relative to previous. No acute intracranial hemorrhage. No acute large vessel territory infarct. No mass lesion, midline shift or mass effect. No hydrocephalus. No extra-axial fluid collection. Vascular: No hyperdense vessel. Calcified atherosclerosis at the skull base. Skull: Scalp soft tissues demonstrate no acute abnormality. Calvarium intact. Sinuses/Orbits: Left ocular prosthesis. Globes and orbital soft tissues demonstrate no acute abnormality. Chronic ethmoidal and maxillary sinusitis. Mastoid air cells are clear. Other: None. IMPRESSION: 1. No acute intracranial abnormality. 2.  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 3. Additional remote right PCA territory infarct, new from previous, but chronic in appearance. Electronically Signed   By: Rise Mu M.D.   On: 07/08/2017 17:00   Dg Hand Complete Right  Result Date: 07/08/2017 CLINICAL DATA:  Pt has swelling to MP joint of 2nd digit; no known injury; pt states it has been swelling for over two days and was swelling prior to his two falls EXAM: RIGHT HAND - COMPLETE 3+ VIEW COMPARISON:  None. FINDINGS: There are degenerative changes primarily involving the first carpometacarpal joint and first metacarpophalangeal joint. No erosions or soft tissue calcifications. Bone mineral density is normal. There is degenerative change involving the second metacarpophalangeal joint and associated with mild soft tissue swelling. No acute fracture or bone lesion. IMPRESSION: Degenerative changes in the hands, likely accounting for the patient's symptoms of the second metacarpophalangeal joint. No evidence for acute  abnormality. Electronically Signed   By: Norva Pavlov M.D.   On: 07/08/2017 17:00    Procedures Procedures (including critical care time)  Medications Ordered in ED Medications  ipratropium-albuterol (DUONEB) 0.5-2.5 (3) MG/3ML nebulizer solution 3 mL (3 mLs Nebulization Given 07/08/17 1943)  metroNIDAZOLE (FLAGYL) tablet 2,000 mg (2,000 mg Oral Given 07/08/17 1928)  sodium chloride 0.9 % bolus 500 mL (0 mLs Intravenous Stopped 07/08/17 2018)  sodium chloride 0.9 % bolus 500 mL (0 mLs Intravenous Stopped 07/08/17 2222)     Initial Impression / Assessment and Plan / ED Course  I have reviewed the triage vital signs and the nursing notes.  Pertinent labs & imaging results that were available during my care of the patient were reviewed by me and considered in my medical decision making (see chart for details).  Clinical Course as of Jul 09 105  Fri Jul 08, 2017  1735  Informed by tech that patient CBG is 58 states he has not eaten since 8 AM.  Patient to be given food by NT.   [BM]  1906 Patient's urine tested positive for trichomonas, will treat with 2 g of oral Flagyl.   [BM]  2128 Patient ambulated well in the hallway, denies dizziness, shortness of breath, lightheadedness.  Pulse oximetry monitor during ambulation patient stated above 90%.    [BM]    Clinical Course User Index [BM] Bill Salinas, PA-C    Patient evaluated for lightheadedness with fall on Coumadin.  No neuro deficits.  During ED course patient found to be hypoglycemic, patient states he had not eaten since 8 AM this morning, patient given sugary foods and blood glucose rose.  Patient was found to be orthostatically hypotensive, patient given 1 L of fluid in department and is orthostatic hypotension resolved.  Patient found to have trichomonas in his urine, patient treated with 2 g of Flagyl in the department, patient informed to avoid alcohol for the next 3 to 5 days after taking Flagyl.  Patient was given a DuoNeb in department for wheezing, history of COPD, patient's wheezing improved after treatment.  Patient ambulated in the hallway on pulse oximetry, patient denies dizziness/lightheadedness/weakness during ambulation.  Patient's heart rate and pulse oximetry stayed within normal limits during ambulation.  Ambulation witnessed by myself and Dr. Anitra Lauth, patient ambulated very well, stable gait. CT head negative for hemorrhage, chest x-ray shows no acute disease. CBC and BMP unremarkable.  DG Hand shows: Degenerative changes in the hands, likely accounting for patient's symptoms of the second metacarpal phalangeal joint. There are no signs of infection of the joint at this time.  Patient is moving the joint better and states he is having less pain at this time.  Patient encouraged to follow-up with his PCP for his joint pain. Patient and family given extensive return precautions.  They  state understanding of return precautions and are agreeable to discharge.  Patient and family encouraged to follow-up with their PCP regarding patient's visit today.  Patient encouraged to drink plenty of fluids and not skip meals to avoid dehydration and low blood sugar.  Patient states that he will do this and that he will follow-up with his primary care doctor tomorrow.  Fall prevention handout given.  Patient is afebrile, no signs of intracranial hemorrhage, vital signs stable, no signs of infection, dizziness/lightheadedness resolved with fluids and food.  Case discussed with Dr. Anitra Lauth, patient seen and evaluated by Arthor Captain PA-C who agrees with plan to discharge with follow-up with primary care provider.   Final Clinical Impressions(s) / ED Diagnoses   Final diagnoses:  Dizziness  Orthostatic hypotension  Hypoglycemia  Trichomonal urethritis in male    ED Discharge Orders    None       Elizabeth Palau 07/09/17 0107    Gwyneth Sprout, MD 07/11/17 2047

## 2017-07-08 NOTE — ED Notes (Signed)
Juice given

## 2017-07-08 NOTE — Discharge Instructions (Addendum)
Avoid alcoholic beverages for at least 3 days.  You will become sick/nauseous if you drink alcohol after your dose of Flagyl here. Please follow-up with your primary care provider tomorrow morning to schedule a follow-up appointment regarding your visit today. Please return to the emergency department for any new or worsening symptoms. Please be sure to drink plenty of fluids to avoid dehydration and be sure to eat to avoid low blood sugar.  Contact a doctor if: Your dizziness does not go away. Your dizziness or light-headedness gets worse. You feel sick to your stomach (nauseous). You have trouble hearing. You have new symptoms. You are unsteady on your feet. You feel like the room is spinning. Get help right away if: You throw up (vomit) or have watery poop (diarrhea), and you cannot eat or drink anything. You have trouble: Talking. Walking. Swallowing. Using your arms, hands, or legs. You feel generally weak. You are not thinking clearly, or you have trouble forming sentences. A friend or family member may notice this. You have: Chest pain. Pain in your belly (abdomen). Shortness of breath. Sweating. Your vision changes. You are bleeding. You have a very bad headache. You have neck pain or a stiff neck. You have a fever. Contact a health care provider if: You vomit. You have diarrhea. You have a fever for more than 2-3 days. You feel more thirsty than usual. You feel weak and tired. Get help right away if: You have chest pain. You have a fast or irregular heartbeat. You develop numbness in any part of your body. You cannot move your arms or your legs. You have trouble speaking. You become sweaty or feel lightheaded. You faint. You feel short of breath. You have trouble staying awake. You feel confused. Contact a health care provider if: You have problems keeping your blood glucose in your target range. You have frequent episodes of hypoglycemia. Get help right  away if: You continue to have hypoglycemia symptoms after eating or drinking something containing glucose. Your blood glucose is at or below 54 mg/dL (3 mmol/L). You have a seizure. You faint.

## 2017-07-08 NOTE — ED Notes (Signed)
Pt. Is in CT at this time.. Will start IV and draw blood upon his return

## 2017-07-08 NOTE — ED Triage Notes (Signed)
Friend reports he has been having dizzy spells since last night and swelling to right hand.

## 2017-07-08 NOTE — ED Notes (Signed)
Pt. Stands with no dizziness and is alert and oriented

## 2017-07-08 NOTE — ED Notes (Addendum)
PA AND RN NOTIFIED OF CBG. PEANUT BUTTER CRACKERS, CHEESE STICKS AND COKE GIVEN.   Pt states he last ate at 0800 today.

## 2017-07-08 NOTE — Progress Notes (Signed)
Patient ambulated around the department while on pulse oximeter.  Patient states that he is feeling much better and is able to walk much further than he was at home earlier today.  Patients SPO2 remained between 91% and 95% while ambulating and his SPO2 was 100% after returning to his room and being placed back on the monitor.

## 2017-07-08 NOTE — ED Triage Notes (Signed)
Patient reports fall last night and the night before last.

## 2018-01-02 IMAGING — CR DG CHEST 1V PORT
2 series · 2 of 2 positions shown · non-contrast
Comparison: Portable chest x-ray [REDACTED] and [DATE].

CLINICAL DATA: Shortness of breath, history of MI, COPD,
hypertension, influenza A.

EXAM:
PORTABLE CHEST 1 VIEW

[AP (1 of 2)]
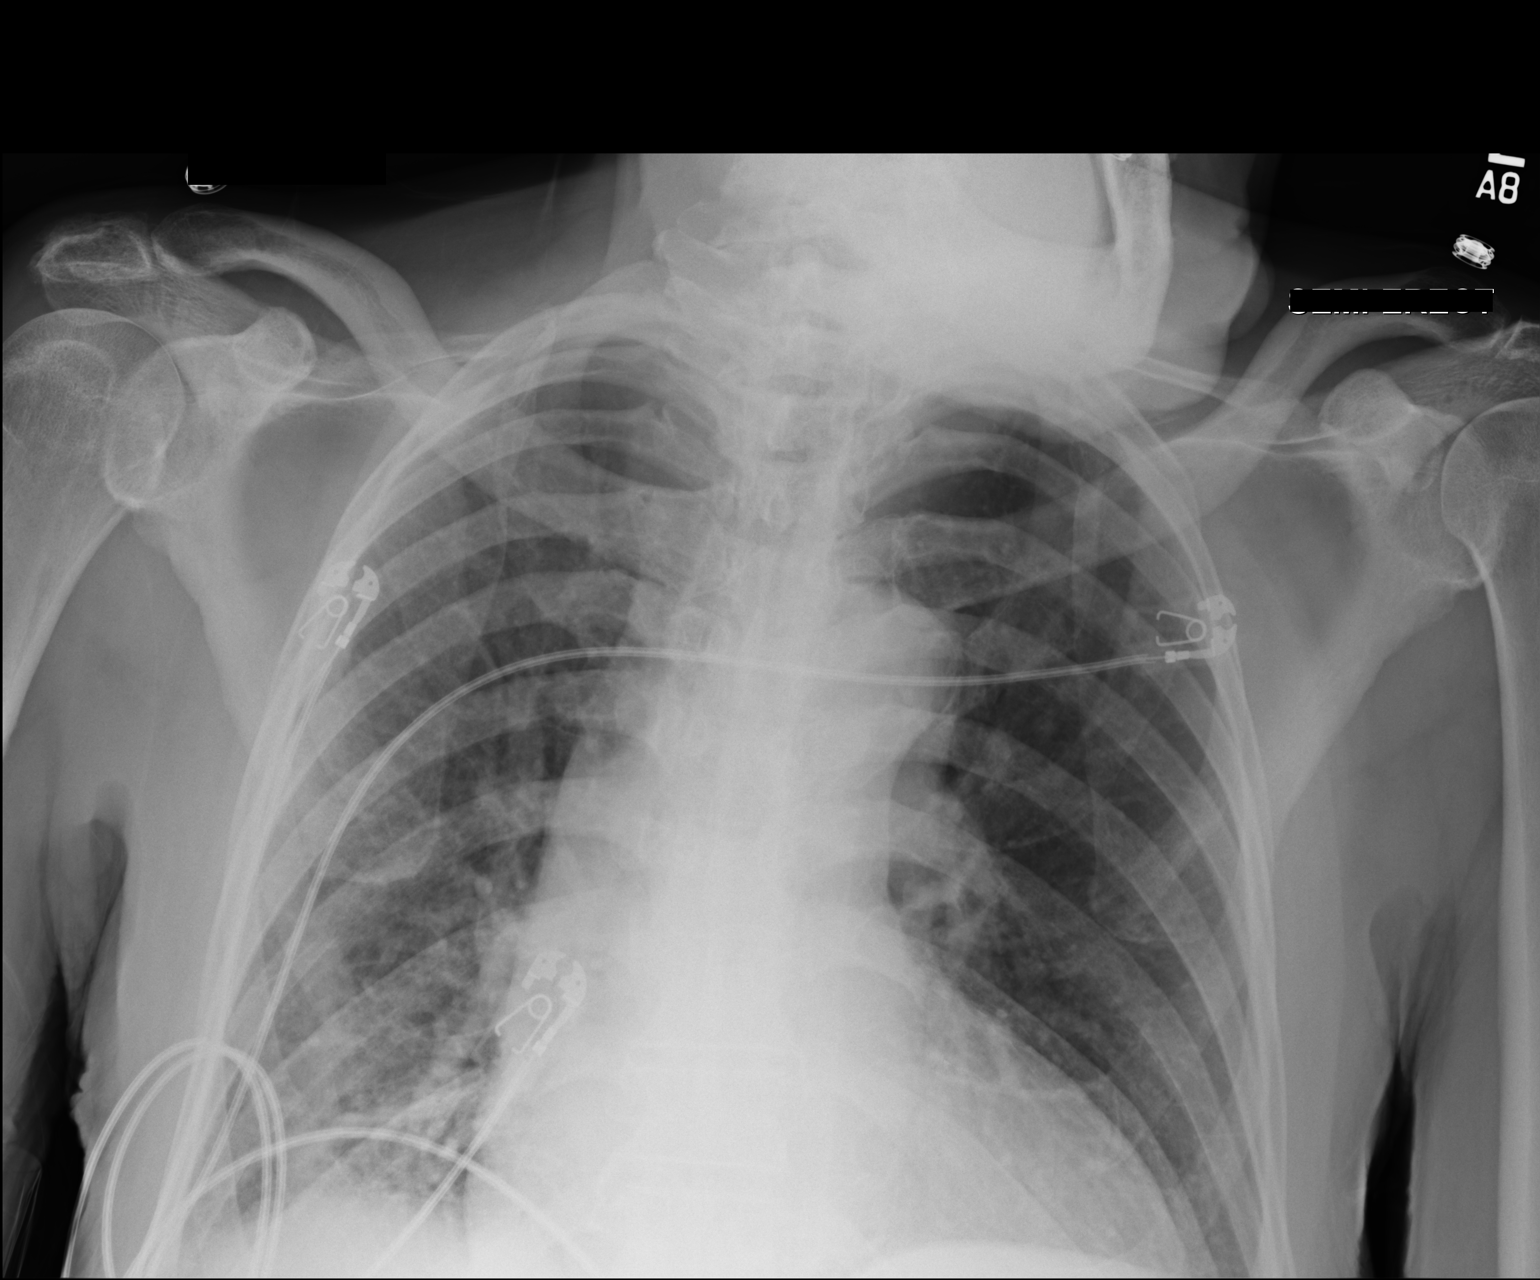

[AP (2 of 2)]
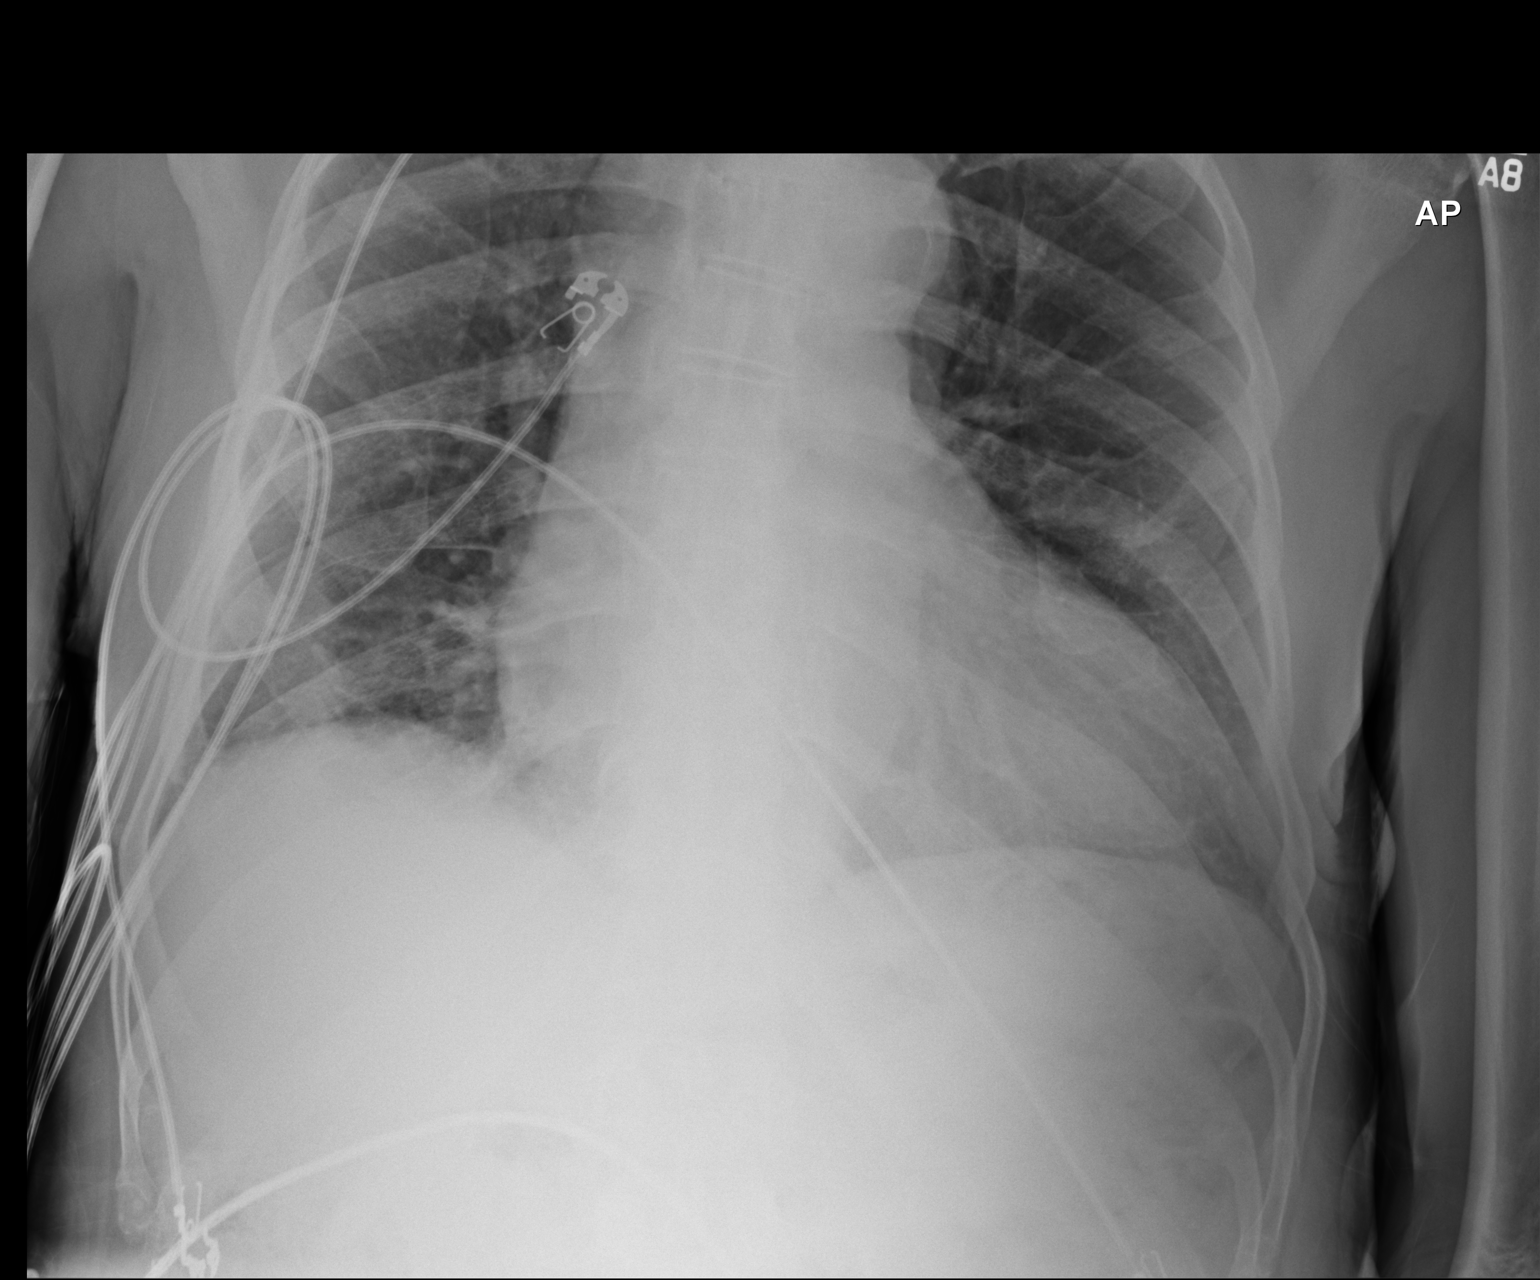

[2 of 2 positions shown; findings below may reference images not displayed]

FINDINGS: The lungs are well-expanded. The interstitial markings have worsened
throughout the right lung but greatest at the base. The left lung is
clear. The cardiac silhouette is enlarged. The pulmonary vascularity
is not engorged. There is no pleural effusion. There is
calcification in the wall of the aortic arch. The bony thorax
exhibits no acute abnormality.
IMPRESSION: COPD. Worsening of interstitial density in the right mid and lower
lung consistent with atelectasis or early pneumonia.

Thoracic aortic atherosclerosis.

## 2018-02-19 IMAGING — CR DG CHEST 2V
2 series · 2 of 2 positions shown · non-contrast
Comparison: 04/07/2016 and prior chest radiographs dating back to
10/19/2015.

CLINICAL DATA: Shortness of breath and edema.

EXAM:
CHEST  2 VIEW

[w chest pa]
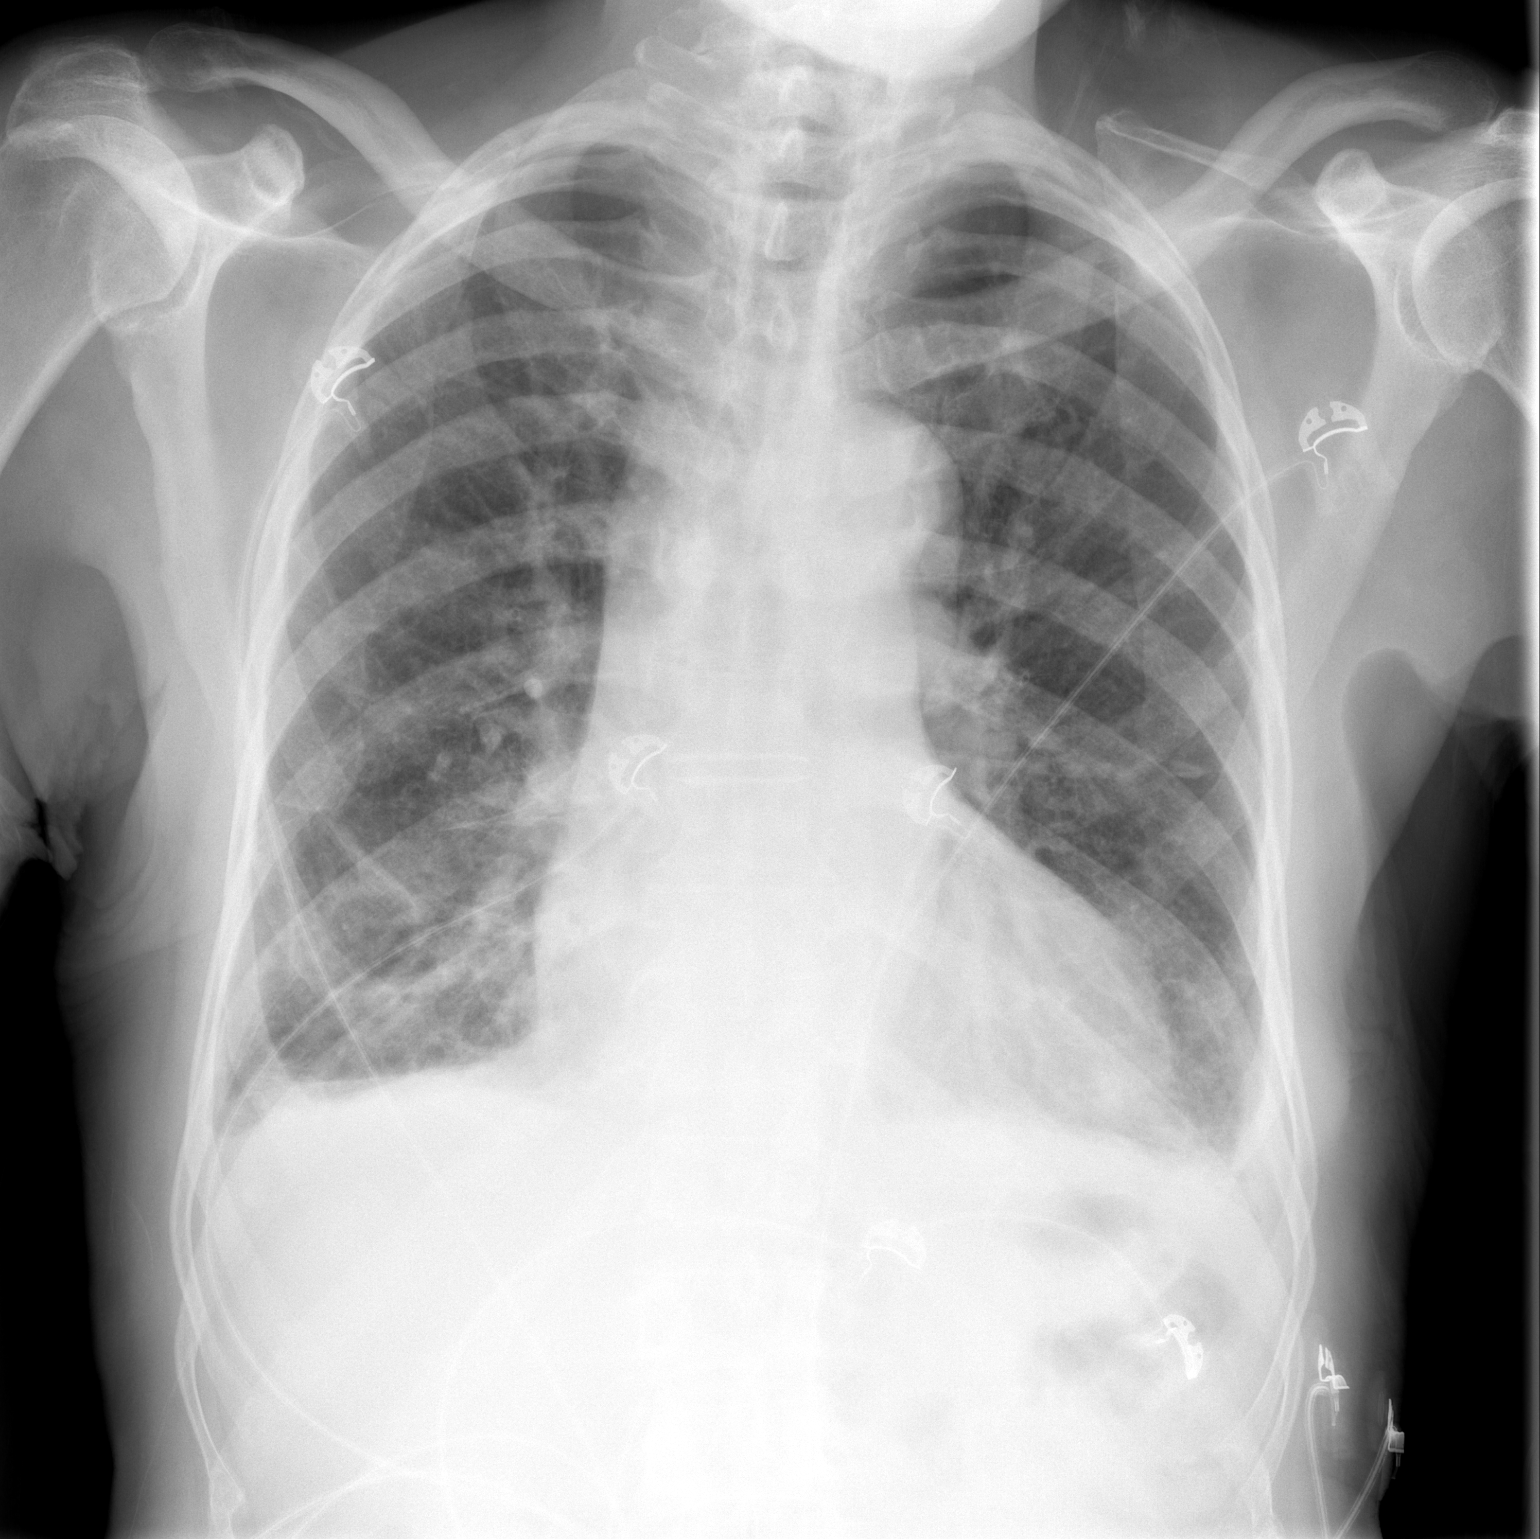

[w chest lat]
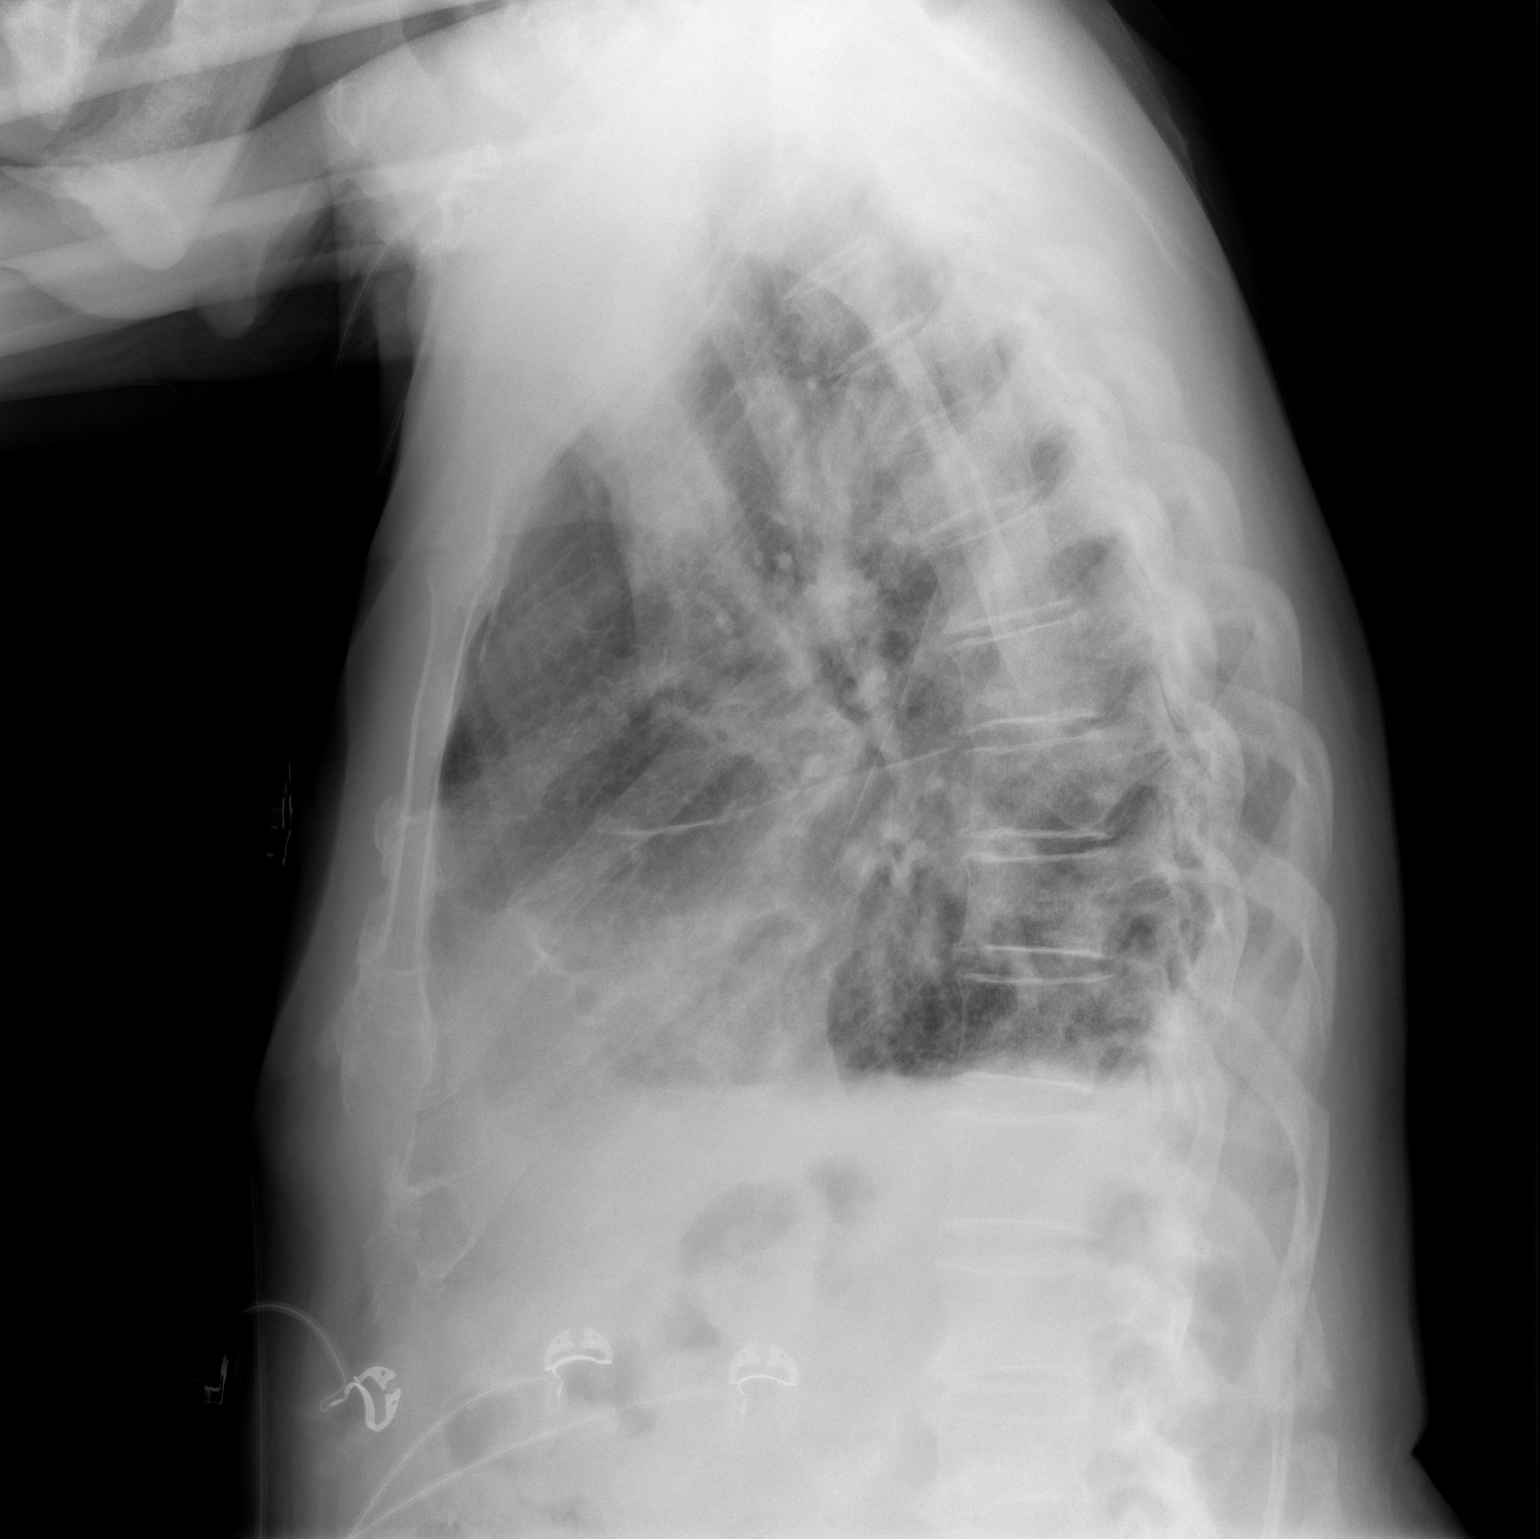

[2 of 2 positions shown; findings below may reference images not displayed]

FINDINGS: Cardiomegaly, pulmonary vascular congestion and trace bilateral
pleural effusions are noted.

No pulmonary mass, acute bony abnormality or pneumothorax.
IMPRESSION: Cardiomegaly with pulmonary vascular congestion and trace bilateral
pleural effusions.

## 2019-05-12 DEATH — deceased
